# Patient Record
Sex: Male | Born: 1945 | Race: White | Hispanic: No | Marital: Married | Smoking: Former smoker
Health system: Southern US, Community
[De-identification: ages and names within clinical notes are randomized; demographics above are authoritative.]

## PROBLEM LIST (undated history)

## (undated) DIAGNOSIS — S86019A Strain of unspecified Achilles tendon, initial encounter: Secondary | ICD-10-CM

## (undated) DIAGNOSIS — T7840XA Allergy, unspecified, initial encounter: Secondary | ICD-10-CM

## (undated) DIAGNOSIS — E785 Hyperlipidemia, unspecified: Secondary | ICD-10-CM

## (undated) HISTORY — DX: Allergy, unspecified, initial encounter: T78.40XA

## (undated) HISTORY — PX: OTHER SURGICAL HISTORY: SHX169

## (undated) HISTORY — DX: Hyperlipidemia, unspecified: E78.5

## (undated) HISTORY — DX: Strain of unspecified achilles tendon, initial encounter: S86.019A

## (undated) HISTORY — PX: TONSILLECTOMY: SUR1361

## (undated) HISTORY — PX: KNEE ARTHROSCOPY: SUR90

---

## 2010-07-17 ENCOUNTER — Ambulatory Visit: Payer: Self-pay | Admitting: Internal Medicine

## 2010-07-17 DIAGNOSIS — S86819A Strain of other muscle(s) and tendon(s) at lower leg level, unspecified leg, initial encounter: Secondary | ICD-10-CM

## 2010-07-17 DIAGNOSIS — S838X9A Sprain of other specified parts of unspecified knee, initial encounter: Secondary | ICD-10-CM | POA: Insufficient documentation

## 2010-07-17 DIAGNOSIS — E785 Hyperlipidemia, unspecified: Secondary | ICD-10-CM | POA: Insufficient documentation

## 2010-07-17 DIAGNOSIS — N529 Male erectile dysfunction, unspecified: Secondary | ICD-10-CM

## 2010-07-17 DIAGNOSIS — J309 Allergic rhinitis, unspecified: Secondary | ICD-10-CM | POA: Insufficient documentation

## 2010-07-17 DIAGNOSIS — B009 Herpesviral infection, unspecified: Secondary | ICD-10-CM | POA: Insufficient documentation

## 2010-07-18 ENCOUNTER — Telehealth: Payer: Self-pay | Admitting: *Deleted

## 2010-07-29 ENCOUNTER — Telehealth: Payer: Self-pay | Admitting: Internal Medicine

## 2010-09-25 ENCOUNTER — Ambulatory Visit: Payer: Self-pay | Admitting: Internal Medicine

## 2010-09-25 LAB — CONVERTED CEMR LAB
AST: 21 units/L (ref 0–37)
Albumin: 3.8 g/dL (ref 3.5–5.2)
Alkaline Phosphatase: 53 units/L (ref 39–117)
Basophils Absolute: 0 10*3/uL (ref 0.0–0.1)
Basophils Relative: 0.5 % (ref 0.0–3.0)
Blood in Urine, dipstick: NEGATIVE
Calcium: 8.8 mg/dL (ref 8.4–10.5)
GFR calc non Af Amer: 99.45 mL/min (ref >60.00)
HDL: 65.1 mg/dL (ref >39.00)
Hemoglobin: 13.9 g/dL (ref 13.0–17.0)
Ketones, urine, test strip: NEGATIVE
Lymphocytes Relative: 29.6 % (ref 12.0–46.0)
Monocytes Relative: 10.2 % (ref 3.0–12.0)
Neutro Abs: 2.9 10*3/uL (ref 1.4–7.7)
Nitrite: NEGATIVE
RBC: 4.14 M/uL — ABNORMAL LOW (ref 4.22–5.81)
Sodium: 139 meq/L (ref 135–145)
Total CHOL/HDL Ratio: 4
Total Protein: 6.2 g/dL (ref 6.0–8.3)
Triglycerides: 126 mg/dL (ref 0.0–149.0)
Urobilinogen, UA: 0.2
WBC: 5.1 10*3/uL (ref 4.5–10.5)

## 2010-10-18 ENCOUNTER — Encounter: Payer: Self-pay | Admitting: Internal Medicine

## 2010-10-18 ENCOUNTER — Ambulatory Visit
Admission: RE | Admit: 2010-10-18 | Discharge: 2010-10-18 | Payer: Self-pay | Source: Home / Self Care | Attending: Internal Medicine | Admitting: Internal Medicine

## 2010-10-18 LAB — CONVERTED CEMR LAB
Cholesterol, target level: 200 mg/dL
HDL goal, serum: 40 mg/dL
LDL Goal: 160 mg/dL

## 2010-10-30 ENCOUNTER — Ambulatory Visit: Admit: 2010-10-30 | Payer: Self-pay | Admitting: Sports Medicine

## 2010-11-05 NOTE — Progress Notes (Signed)
Summary: refill.  Never got these filled originally.....  Phone Note Call from Patient   Caller: Patient Call For: Stacie Glaze MD Summary of Call: Pt is asking for Levitra to be mailed to his home, and Accyclovir sent electronically to [facility] [place]. Initial call taken by: Lynann Beaver CMA AAMA,  July 29, 2010 12:14 PM  Follow-up for Phone Call        pt states never got print out of acyclovir- will send to [facility] and will print levitra scripot and pt will pick up Follow-up by: Willy Eddy, LPN,  July 29, 2010 12:57 PM    Prescriptions: LEVITRA 20 MG TABS (VARDENAFIL HCL) Take as directed  #6 x 11   Entered by:   Willy Eddy, LPN   Authorized by:   Stacie Glaze MD   Signed by:   Willy Eddy, LPN on 04/54/0981   Method used:   Print then Give to Patient   RxID:   [id number] ACYCLOVIR 400 MG TABS (ACYCLOVIR) one by mouth three times a day as needed  #30 x 1   Entered by:   Willy Eddy, LPN   Authorized by:   Stacie Glaze MD   Signed by:   Willy Eddy, LPN on 57/84/6962   Method used:   Electronically to        [facility] (retail)       [address]       [place], [place]  [address]       Ph: [phone]       Fax: [phone]   RxID:   [id number]

## 2010-11-05 NOTE — Assessment & Plan Note (Signed)
Summary: new to establish--ok per dr jenkins//ccm   Vital Signs:  Patient profile:   65 year old male Height:      70.5 inches Weight:      170 pounds BMI:     24.13 Temp:     98.2 degrees F oral Pulse rate:   64 / minute Resp:     14 per minute BP sitting:   90 / 60  (left arm)  Vitals Entered By: Willy Eddy, LPN (July 17, 2010 2:27 PM) CC: new pt to establish--stopped lipitor for taking for while due to myalgia-c/o hamstring that will not heal properly Is Patient Diabetic? No Pain Assessment Patient in pain? no        Primary Care Kebin Maye:  Stacie Glaze MD  CC:  new pt to establish--stopped lipitor for taking for while due to myalgia-c/o hamstring that will not heal properly.  History of Present Illness: new to establish complete  PMHx and old records reviewed with pt ( from [place]) hx of hyperlipidemia with intolerance of daily lipitor risks include lipids, age, smoker (past) but no family hx  hx also includes hx of herpes ( cold sore) and ED on levitra with not hx of testosterone monitering  Preventive Screening-Counseling & Management  Alcohol-Tobacco     Smoking Status: quit     Year Started: 1967     Year Quit: 1973     Tobacco Counseling: to remain off tobacco products  Caffeine-Diet-Exercise     Does Patient Exercise: yes      Drug Use:  no.    Problems Prior to Update: 1)  Erectile Dysfunction, Organic  (ICD-607.84) 2)  Muscle Strain, Hamstring Muscle  (ICD-844.8) 3)  Herpes Labialis  (ICD-054.9) 4)  Family History Breast Cancer 1st Degree Relative <50  (ICD-V16.3) 5)  Family History of Alcoholism/addiction  (ICD-V61.41) 6)  Allergic Rhinitis  (ICD-477.9) 7)  Hyperlipidemia  (ICD-272.4)  Medications Prior to Update: 1)  None  Current Medications (verified): 1)  Acyclovir 400 Mg Tabs (Acyclovir) .... One By Mouth Three Times A Day As Needed 2)  Krill Oil 1000 Mg Caps (Krill Oil) .... Two  By Mouth Two Times A Day 3)  Co Q-10 Plus  Red Yeast Rice 60-600 Mg Caps (Coenzyme Q10-Red Yeast Rice) .... One By Mouth Daily 4)  Levitra 20 Mg Tabs (Vardenafil Hcl) .... Take As Directed  Allergies (verified): No Known Drug Allergies  Past History:  Family History: Last updated: 07/17/2010 Family History of Alcoholism/Addiction Family History Breast cancer 1st degree relative <50  Social History: Last updated: 07/17/2010 Occupation:actor,director,teacher Married Alcohol use-yes Drug use-no Regular exercise-yes  Risk Factors: Exercise: yes (07/17/2010)  Risk Factors: Smoking Status: quit (07/17/2010)  Past medical, surgical, family and social histories (including risk factors) reviewed, and no changes noted (except as noted below).  Past Medical History: Hyperlipidemia Allergic rhinitis  Past Surgical History: Tonsillectomy 1954  Family History: Reviewed history and no changes required. Family History of Alcoholism/Addiction Family History Breast cancer 1st degree relative <50  Social History: Reviewed history and no changes required. Occupation:actor,director,teacher Married Alcohol use-yes Drug use-no Regular exercise-yes Smoking Status:  quit Drug Use:  no Does Patient Exercise:  yes  Review of Systems  The patient denies anorexia, fever, weight loss, weight gain, vision loss, decreased hearing, hoarseness, chest pain, syncope, dyspnea on exertion, peripheral edema, prolonged cough, headaches, hemoptysis, abdominal pain, melena, hematochezia, severe indigestion/heartburn, hematuria, incontinence, genital sores, muscle weakness, suspicious skin lesions, transient blindness, difficulty walking, depression, unusual  weight change, abnormal bleeding, enlarged lymph nodes, angioedema, breast masses, and testicular masses.    Physical Exam  General:  alert, well-developed, and well-nourished.   Head:  normocephalic and atraumatic.   Eyes:  pupils equal and pupils round.   Ears:  R ear normal and L ear  normal.   Nose:  no external deformity and no nasal discharge.   Mouth:  good dentition and pharynx pink and moist.   Neck:  No deformities, masses, or tenderness noted. Lungs:  Normal respiratory effort, chest expands symmetrically. Lungs are clear to auscultation, no crackles or wheezes. Heart:  Normal rate and regular rhythm. S1 and S2 normal without gallop, murmur, click, rub or other extra sounds. Abdomen:  Bowel sounds positive,abdomen soft and non-tender without masses, organomegaly or hernias noted. Genitalia:  Testes bilaterally descended without nodularity, tenderness or masses. No scrotal masses or lesions. No penis lesions or urethral discharge. Prostate:  Prostate gland firm and smooth, no enlargement, nodularity, tenderness, mass, asymmetry or induration.   Impression & Recommendations:  Problem # 1:  HERPES LABIALIS (ICD-054.9) on acyclovir for outbreaks  Problem # 2:  HYPERLIPIDEMIA (ICD-272.4) stopped lipitor due to muscle cramping heard on NPR the risks and stopped  Problem # 3:  MUSCLE STRAIN, HAMSTRING MUSCLE (ICD-844.8) referral to [facility] sports medicine  clinic  Problem # 4:  ALLERGIC RHINITIS (ICD-477.9)  Discussed use of allergy medications and environmental measures.   Problem # 5:  ERECTILE DYSFUNCTION, ORGANIC (ICD-607.84)  Discussed proper use of medications, as well as side effects.   His updated medication list for this problem includes:    Levitra 20 Mg Tabs (Vardenafil hcl) .Marland Kitchen... Take as directed  Complete Medication List: 1)  Acyclovir 400 Mg Tabs (Acyclovir) .... One by mouth three times a day as needed 2)  Krill Oil 1000 Mg Caps (Krill oil) .... Two  by mouth two times a day 3)  Co Q-10 Plus Red Yeast Rice 60-600 Mg Caps (Coenzyme q10-red yeast rice) .... One by mouth daily 4)  Levitra 20 Mg Tabs (Vardenafil hcl) .... Take as directed  Patient Instructions: 1)  Please schedule a follow-up appointment in 3 months.  CPX with  laps Prescriptions: ACYCLOVIR 400 MG TABS (ACYCLOVIR) one by mouth three times a day as needed  #3 x 0   Entered and Authorized by:   Stacie Glaze MD   Signed by:   Stacie Glaze MD on 07/17/2010   Method used:   Print then Give to Patient   RxID:   [id number]    Preventive Care Screening  Colonoscopy:    Date:  07/06/2003    Next Due:  07/2013    Results:  normal  Last Tetanus Booster:    Date:  05/02/2005    Results:  Historical    Immunization History:  Tetanus/Td Immunization History:    Tetanus/Td:  historical (05/02/2005)     Preventive Care Screening  Colonoscopy:    Date:  07/06/2003    Next Due:  07/2013    Results:  normal  Last Tetanus Booster:    Date:  05/02/2005    Results:  Historical

## 2010-11-05 NOTE — Progress Notes (Signed)
Summary: Pt called and gave info on what pharmacy he uses.  Phone Note Call from Patient   Caller: Patient Reason for Call: Acute Illness Summary of Call: Pt called and said that he uses [facility]. Pt just wanted Dr. Lovell Sheehan to have this infor for future use, when script are needed.    Initial call taken by: Lucy Antigua,  July 18, 2010 8:45 AM  Follow-up for Phone Call        put in computer Follow-up by: Willy Eddy, LPN,  July 18, 2010 8:49 AM

## 2010-11-07 NOTE — Assessment & Plan Note (Signed)
Summary: CPX/CJR   Vital Signs:  Patient profile:   65 year old male Height:      70.5 inches Weight:      172 pounds BMI:     24.42 Temp:     98.2 degrees F oral Pulse rate:   52 / minute Resp:     14 per minute BP sitting:   100 / 60  (left arm)  Vitals Entered By: Willy Eddy, LPN (October 18, 2010 2:09 PM) CC: cpx, Lipid Management Is Patient Diabetic? No   Primary Care Provider:  Stacie Glaze MD  CC:  cpx and Lipid Management.  History of Present Illness: The pt was asked about all immunizations, health maint. services that are appropriate to their age and was given guidance on diet exercize  and weight management   he has been doing well and has no problems to report other than the chronic issues of erectile dysfunction hyperlipidemia and minor osteoarthritic pain. he continues on pulse therapy with Crestor 20 mg one by mouth weekly this has been effective in controlling his cholesterol has not resulted in side effects he  remains physically active and exercises regularly  his erectile dysfunction is treated with Levitra we discussed the monitoring of testosterone and testosterone replacement as a possible adjuvant therapy to his erectile dysfunction.   Lipid Management History:      Positive NCEP/ATP III risk factors include male age 56 years old or older.  Negative NCEP/ATP III risk factors include HDL cholesterol greater than 60 and non-tobacco-user status.    Preventive Screening-Counseling & Management  Alcohol-Tobacco     Smoking Status: quit     Year Started: 1967     Year Quit: 1973     Tobacco Counseling: to remain off tobacco products  Problems Prior to Update: 1)  Preventive Health Care  (ICD-V70.0) 2)  Erectile Dysfunction, Organic  (ICD-607.84) 3)  Muscle Strain, Hamstring Muscle  (ICD-844.8) 4)  Herpes Labialis  (ICD-054.9) 5)  Family History Breast Cancer 1st Degree Relative <50  (ICD-V16.3) 6)  Family History of Alcoholism/addiction   (ICD-V61.41) 7)  Allergic Rhinitis  (ICD-477.9) 8)  Hyperlipidemia  (ICD-272.4)  Current Problems (verified): 1)  Erectile Dysfunction, Organic  (ICD-607.84) 2)  Muscle Strain, Hamstring Muscle  (ICD-844.8) 3)  Herpes Labialis  (ICD-054.9) 4)  Family History Breast Cancer 1st Degree Relative <50  (ICD-V16.3) 5)  Family History of Alcoholism/addiction  (ICD-V61.41) 6)  Allergic Rhinitis  (ICD-477.9) 7)  Hyperlipidemia  (ICD-272.4)  Medications Prior to Update: 1)  Acyclovir 400 Mg Tabs (Acyclovir) .... One By Mouth Three Times A Day As Needed 2)  Krill Oil 1000 Mg Caps (Krill Oil) .... Two  By Mouth Two Times A Day 3)  Co Q-10 Plus Red Yeast Rice 60-600 Mg Caps (Coenzyme Q10-Red Yeast Rice) .... One By Mouth Daily 4)  Levitra 20 Mg Tabs (Vardenafil Hcl) .... Take As Directed  Current Medications (verified): 1)  Acyclovir 400 Mg Tabs (Acyclovir) .... One By Mouth Three Times A Day As Needed 2)  Krill Oil 1000 Mg Caps (Krill Oil) .... Two  By Mouth Two Times A Day 3)  Levitra 20 Mg Tabs (Vardenafil Hcl) .... Take As Directed 4)  Aspirin 81 Mg Tbec (Aspirin) .Marland Kitchen.. 1 Once Daily 5)  Multivitamins  Tabs (Multiple Vitamin) .Marland Kitchen.. 1 Once Daily 6)  Crestor 20 Mg Tabs (Rosuvastatin Calcium) .... One By Mouth Friday Nights  Allergies (verified): No Known Drug Allergies  Past History:  Family History:  Last updated: 07/17/2010 Family History of Alcoholism/Addiction Family History Breast cancer 1st degree relative <50  Social History: Last updated: 07/17/2010 Occupation:actor,director,teacher Married Alcohol use-yes Drug use-no Regular exercise-yes  Risk Factors: Exercise: yes (07/17/2010)  Risk Factors: Smoking Status: quit (10/18/2010)  Past medical, surgical, family and social histories (including risk factors) reviewed, and no changes noted (except as noted below).  Past Medical History: Reviewed history from 07/17/2010 and no changes required. Hyperlipidemia Allergic  rhinitis  Past Surgical History: Reviewed history from 07/17/2010 and no changes required. Tonsillectomy 1954  Family History: Reviewed history from 07/17/2010 and no changes required. Family History of Alcoholism/Addiction Family History Breast cancer 1st degree relative <50  Social History: Reviewed history from 07/17/2010 and no changes required. Occupation:actor,director,teacher Married Alcohol use-yes Drug use-no Regular exercise-yes  Review of Systems  The patient denies anorexia, fever, weight loss, weight gain, vision loss, decreased hearing, hoarseness, chest pain, syncope, dyspnea on exertion, peripheral edema, prolonged cough, headaches, hemoptysis, abdominal pain, melena, hematochezia, severe indigestion/heartburn, hematuria, incontinence, genital sores, muscle weakness, suspicious skin lesions, transient blindness, difficulty walking, depression, unusual weight change, abnormal bleeding, enlarged lymph nodes, angioedema, and breast masses.         Flu Vaccine Consent Questions     Do you have a history of severe allergic reactions to this vaccine? no    Any prior history of allergic reactions to egg and/or gelatin? no    Do you have a sensitivity to the preservative Thimersol? no    Do you have a past history of Guillan-Barre Syndrome? no    Do you currently have an acute febrile illness? no    Have you ever had a severe reaction to latex? no    Vaccine information given and explained to patient? yes    Are you currently pregnant? no    Lot Number:[id number]   Exp Date:04/05/2011   Site Given  Left Deltoid IM   Physical Exam  General:  alert, well-developed, and well-nourished.   Head:  normocephalic and atraumatic.   Eyes:  pupils equal and pupils round.   Ears:  R ear normal and L ear normal.   Nose:  no external deformity and no nasal discharge.   Mouth:  good dentition and pharynx pink and moist.   Neck:  No deformities, masses, or tenderness  noted. Lungs:  Normal respiratory effort, chest expands symmetrically. Lungs are clear to auscultation, no crackles or wheezes. Heart:  Normal rate and regular rhythm. S1 and S2 normal without gallop, murmur, click, rub or other extra sounds. Abdomen:  Bowel sounds positive,abdomen soft and non-tender without masses, organomegaly or hernias noted. Genitalia:  Testes bilaterally descended without nodularity, tenderness or masses. No scrotal masses or lesions. No penis lesions or urethral discharge. Prostate:  Prostate gland firm and smooth, no enlargement, nodularity, tenderness, mass, asymmetry or induration.   Impression & Recommendations:  Problem # 1:  PREVENTIVE HEALTH CARE (ICD-V70.0) Assessment Unchanged  Orders: EKG w/ Interpretation (93000)  Colonoscopy: normal (07/06/2003) Td Booster: Historical (05/02/2005)   Flu Vax: Fluvax 3+ (10/18/2010)   Chol: 268 (09/25/2010)   HDL: 65.10 (09/25/2010)   TG: 126.0 (09/25/2010) TSH: 1.73 (09/25/2010)   PSA: 1.33 (09/25/2010) Next Colonoscopy due:: 07/2013 (07/17/2010)  Discussed using sunscreen, use of alcohol, drug use, self testicular exam, routine dental care, routine eye care, routine physical exam, seat belts, multiple vitamins, osteoporosis prevention, adequate calcium intake in diet, and recommendations for immunizations.  Discussed exercise and checking cholesterol.  Discussed gun safety, safe sex, and contraception.  Also recommend checking PSA.  Problem # 2:  HYPERLIPIDEMIA (ICD-272.4) Assessment: Unchanged  currently at goal with excellent toleration of pulse therapy Labs Reviewed: SGOT: 21 (09/25/2010)   SGPT: 21 (09/25/2010)  Lipid Goals: Chol Goal: 200 (10/18/2010)   HDL Goal: 40 (10/18/2010)   LDL Goal: 160 (10/18/2010)   TG Goal: 150 (10/18/2010)  10 Yr Risk Heart Disease: Not enough information   HDL:65.10 (09/25/2010)  Chol:268 (09/25/2010)  Trig:126.0 (09/25/2010)  His updated medication list for this problem  includes:    Crestor 20 Mg Tabs (Rosuvastatin calcium) ..... One by mouth friday nights  Problem # 3:  MUSCLE STRAIN, HAMSTRING MUSCLE (ICD-844.8)  persistant pain in hamdstring with recurrent injury in sports  Orders: Sports Medicine (Sports Med)  Problem # 4:  ERECTILE DYSFUNCTION, ORGANIC (ICD-607.84)  monitor testosterone and CC views a candidate for testosterone  monitoring.he continues to feel that occasional Levitra is adequate treatment for his rectal dysfunction His updated medication list for this problem includes:    Levitra 20 Mg Tabs (Vardenafil hcl) .Marland Kitchen... Take as directed  Discussed proper use of medications, as well as side effects.   Complete Medication List: 1)  Acyclovir 400 Mg Tabs (Acyclovir) .... One by mouth three times a day as needed 2)  Krill Oil 1000 Mg Caps (Krill oil) .... Two  by mouth two times a day 3)  Levitra 20 Mg Tabs (Vardenafil hcl) .... Take as directed 4)  Aspirin 81 Mg Tbec (Aspirin) .Marland Kitchen.. 1 once daily 5)  Multivitamins Tabs (Multiple vitamin) .Marland Kitchen.. 1 once daily 6)  Crestor 20 Mg Tabs (Rosuvastatin calcium) .... One by mouth friday nights  Other Orders: Admin 1st Vaccine (04540) Flu Vaccine 21yrs + (98119)  Lipid Assessment/Plan:      Based on NCEP/ATP III, the patient's risk factor category is "0-1 risk factors".  The patient's lipid goals are as follows: Total cholesterol goal is 200; LDL cholesterol goal is 160; HDL cholesterol goal is 40; Triglyceride goal is 150.     Patient Instructions: 1)  Please schedule a follow-up appointment in 3 months. 2)  Hepatic Panel prior to visit, ICD-9:995.20 3)  Lipid Panel prior to visit, ICD-9:272.4   Orders Added: 1)  Admin 1st Vaccine [90471] 2)  Flu Vaccine 20yrs + [14782] 3)  EKG w/ Interpretation [93000] 4)  Sports Medicine [Sports Med] 5)  Est. Patient 40-64 years [99396] 6)  Est. Patient Level III [95621]

## 2010-11-07 NOTE — Letter (Signed)
Summary: Handout Printed  Printed Handout:  - *[facility] Patient Instructions 

## 2010-11-18 ENCOUNTER — Ambulatory Visit: Payer: Self-pay | Admitting: Sports Medicine

## 2010-12-09 ENCOUNTER — Ambulatory Visit (INDEPENDENT_AMBULATORY_CARE_PROVIDER_SITE_OTHER): Payer: Self-pay | Admitting: Sports Medicine

## 2010-12-09 ENCOUNTER — Encounter: Payer: Self-pay | Admitting: Sports Medicine

## 2010-12-09 DIAGNOSIS — M25569 Pain in unspecified knee: Secondary | ICD-10-CM

## 2010-12-09 DIAGNOSIS — S86819A Strain of other muscle(s) and tendon(s) at lower leg level, unspecified leg, initial encounter: Secondary | ICD-10-CM

## 2010-12-17 NOTE — Assessment & Plan Note (Signed)
Summary: 8:45 NP HAMSTRING INJURY/MJD   Vital Signs:  Patient profile:   65 year old male Height:      70 inches Weight:      160 pounds BP sitting:   107 / 70  Vitals Entered By: Lillia Pauls CMA (December 09, 2010 8:42 AM)  Primary Provider:  Stacie Glaze MD  CC:  Right thight pain and Left knee injury .  History of Present Illness:   Hamstring Injury- March 2011 during a raquectball match, feels like it  just won't heal, was on Lipitorat one point and felt that was contributing to the prolonged healing process, took Ibuprofen, used ICE for a a month directly after injury-  uses a thigh wrap when playing now  , typically does not have to take anti-inflammatories Plays doubles with raquetball Does not work out his lower extremeties at the gym  Left Knee- quit running 10 years ago secondary to bilateral knee pain, 3 weeks ago was playing raquetball and began to have worsening knee pain, no specific injury no fall to the knee, noticed swelling behind the knee and medial aspect of knee, also - noticed disoloaration on media aspect of knee , continued playing raquetball - currently using a knee stabilizer-  swelling improving, uses Ibuprofen prn , no locking or giving out of knee , pain improved on medial aspect of knee where swelling is   Note- no  specific imaging of knees have been done   Exercise- per above and Uses Ellipital , unable to use treadmill or bike secondary to knee pain   Current Medications (verified): 1)  Acyclovir 400 Mg Tabs (Acyclovir) .... One By Mouth Three Times A Day As Needed 2)  Krill Oil 1000 Mg Caps (Krill Oil) .... Two  By Mouth Two Times A Day 3)  Levitra 20 Mg Tabs (Vardenafil Hcl) .... Take As Directed 4)  Aspirin 81 Mg Tbec (Aspirin) .Marland Kitchen.. 1 Once Daily 5)  Multivitamins  Tabs (Multiple Vitamin) .Marland Kitchen.. 1 Once Daily 6)  Crestor 20 Mg Tabs (Rosuvastatin Calcium) .... One By Mouth Friday Nights  Allergies (verified): No Known Drug Allergies  Past  History:  Past Medical History: Last updated: 07/17/2010 Hyperlipidemia Allergic rhinitis  Past Surgical History: Last updated: 07/17/2010 Tonsillectomy 1954  Family History: Last updated: 07/17/2010 Family History of Alcoholism/Addiction Family History Breast cancer 1st degree relative <50  Social History: Last updated: 07/17/2010 Occupation:actor,director,teacher Married Alcohol use-yes Drug use-no Regular exercise-yes    Physical Exam  General:  NAD, vitals noted Msk:  Knee: Left knee- +effusion noted on medial and posterior aspects of knee, mild warmth Mild TTP over medial aspect, patella non tender, inferior  joint line non tender ROM decreased in extension- 3 degress left knee Flexion- left knee 130 degrees Flexion- Right knee 150 degrees +McMurrays- left knee +crepitus Neg anterior drawer, neg lachmans Ligaments with solid consistent endpoints including ACL, PCL, LCL, MCL. Patellar and quadriceps tendons unremarkable.  Hamstring and quadriceps strength is normal. - left leg 4/5  strength - Right hamstring  Leg length- left 0.5in shorter than right this seems 2/2 lack of full knee extension      Impression & Recommendations:  Problem # 1:  KNEE PAIN, LEFT (ICD-719.46) Assessment New Chronic knee pain with an acute process, exam shows evidence of arthritic changes, and likley old medial meniscal problem. Effusion improved and pain tolerable at this point.  See instructions Given exercises to strengthen quads regarding knee pain, continue knee brace, as needed anti-inflammatories, no  imaging needed Start Glucoasmine/Condroitin -  His updated medication list for this problem includes:    Aspirin 81 Mg Tbec (Aspirin) .Marland Kitchen... 1 once daily  Problem # 2:  MUSCLE STRAIN, HAMSTRING MUSCLE (ICD-844.8) Assessment: New Chronic problem, he has never truly rehabed and strengthened the right hamstring, given exercises to complete at least 3 times a week Follow-up in  6 weeks to monitor progress   Milinda Antis MD, PGY-3  Complete Medication List: 1)  Acyclovir 400 Mg Tabs (Acyclovir) .... One by mouth three times a day as needed 2)  Krill Oil 1000 Mg Caps (Krill oil) .... Two  by mouth two times a day 3)  Levitra 20 Mg Tabs (Vardenafil hcl) .... Take as directed 4)  Aspirin 81 Mg Tbec (Aspirin) .Marland Kitchen.. 1 once daily 5)  Multivitamins Tabs (Multiple vitamin) .Marland Kitchen.. 1 once daily 6)  Crestor 20 Mg Tabs (Rosuvastatin calcium) .... One by mouth friday nights  Patient Instructions: 1)  Start the Hamstring exercises 2)  Start the quad exercises to help strengthen your left knee 3)  Continue using the knee sleeve 4)  Glucosamine Condroitin  700/650mg - take 1 tablet by mouth twice a day for 3 months  5)  Use Aleve or ibuprofen as needed 6)  Semi-recumbant Biking  7)  Use the wedge in the left shoe 8)  Follow-up in 6 weeks   Orders Added: 1)  Consultation Level II [62952]

## 2010-12-17 NOTE — Letter (Signed)
Summary: *Consult Note  [facility]  [address]   [place], [place] [address]   Phone: [phone]  Fax: [phone]    Re:    Gordon Baker [place] DOB:    1945/12/10 Dr. Darryll Capers [facility] 12/09/2010   Dear Gordon Baker:    Thank you for requesting that we see the above patient for consultation.  A copy of the detailed office note will be sent under separate cover, for your review.  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  I don't think we have to do additional workup at this point as it will not change the conservative rehab plan outlined.  His Right Hamstring is still weak compared to left.  This has never been strengthened since the original injury and he is given a home protocol.  I will recheck results of this in 6 weeks.   New Orders include:  1)  Consultation Level II [99242]  New Medications started today include: Glucosamine + chondroitin trial    After today's visit, the patients current medications include: 1)  ACYCLOVIR 400 MG TABS (ACYCLOVIR) one by mouth three times a day as needed 2)  KRILL OIL 1000 MG CAPS (KRILL OIL) two  by mouth two times a day 3)  LEVITRA 20 MG TABS (VARDENAFIL HCL) Take as directed 4)  ASPIRIN 81 MG TBEC (ASPIRIN) 1 once daily 5)  MULTIVITAMINS  TABS (MULTIPLE VITAMIN) 1 once daily 6)  CRESTOR 20 MG TABS (ROSUVASTATIN CALCIUM) one by mouth friday nights   Thank you for this consultation.  If you have any further questions regarding the care of this patient, please do not hesitate to contact me @ [phone].  Thank you for this opportunity to look after your patient.  Sincerely,  Vincent Gros MD

## 2011-01-08 ENCOUNTER — Encounter: Payer: Self-pay | Admitting: Internal Medicine

## 2011-01-08 ENCOUNTER — Other Ambulatory Visit (INDEPENDENT_AMBULATORY_CARE_PROVIDER_SITE_OTHER): Payer: BC Managed Care – PPO

## 2011-01-08 DIAGNOSIS — T887XXA Unspecified adverse effect of drug or medicament, initial encounter: Secondary | ICD-10-CM

## 2011-01-08 DIAGNOSIS — E785 Hyperlipidemia, unspecified: Secondary | ICD-10-CM

## 2011-01-08 LAB — LIPID PANEL
Cholesterol: 240 mg/dL — ABNORMAL HIGH (ref 0–200)
HDL: 67.6 mg/dL
Total CHOL/HDL Ratio: 4
Triglycerides: 86 mg/dL (ref 0.0–149.0)
VLDL: 17.2 mg/dL (ref 0.0–40.0)

## 2011-01-08 LAB — HEPATIC FUNCTION PANEL
ALT: 22 U/L (ref 0–53)
AST: 26 U/L (ref 0–37)
Albumin: 3.8 g/dL (ref 3.5–5.2)
Alkaline Phosphatase: 57 U/L (ref 39–117)
Bilirubin, Direct: 0.1 mg/dL (ref 0.0–0.3)
Total Bilirubin: 1 mg/dL (ref 0.3–1.2)
Total Protein: 6.4 g/dL (ref 6.0–8.3)

## 2011-01-08 LAB — LDL CHOLESTEROL, DIRECT: Direct LDL: 149.2 mg/dL

## 2011-01-10 ENCOUNTER — Other Ambulatory Visit: Payer: Self-pay

## 2011-01-17 ENCOUNTER — Ambulatory Visit: Payer: Self-pay | Admitting: Internal Medicine

## 2011-02-12 ENCOUNTER — Encounter: Payer: Self-pay | Admitting: Internal Medicine

## 2011-02-12 ENCOUNTER — Ambulatory Visit (INDEPENDENT_AMBULATORY_CARE_PROVIDER_SITE_OTHER): Payer: BC Managed Care – PPO | Admitting: Internal Medicine

## 2011-02-12 VITALS — BP 100/60 | HR 60 | Temp 98.2°F | Resp 14 | Ht 71.0 in | Wt 170.0 lb

## 2011-02-12 DIAGNOSIS — E756 Lipid storage disorder, unspecified: Secondary | ICD-10-CM

## 2011-02-12 NOTE — Patient Instructions (Signed)
Increase the kril will capsules to 4 a day and will monitor in 3 months

## 2011-02-12 NOTE — Progress Notes (Signed)
  Subjective:    Patient ID: Gordon Baker, male    DOB: 03-May-1946, 65 y.o.   MRN: [id number]  HPI    Review of Systems  Constitutional: Negative for fever and fatigue.  HENT: Negative for hearing loss, congestion, neck pain and postnasal drip.   Eyes: Negative for discharge, redness and visual disturbance.  Respiratory: Negative for cough, shortness of breath and wheezing.   Cardiovascular: Negative for leg swelling.  Gastrointestinal: Negative for abdominal pain, constipation and abdominal distention.  Genitourinary: Negative for urgency and frequency.  Musculoskeletal: Negative for joint swelling and arthralgias.  Skin: Negative for color change and rash.  Neurological: Negative for weakness and light-headedness.  Hematological: Negative for adenopathy.  Psychiatric/Behavioral: Negative for behavioral problems.       Objective:   Physical Exam  Constitutional: He appears well-developed and well-nourished.  HENT:  Head: Normocephalic and atraumatic.  Eyes: Conjunctivae are normal. Pupils are equal, round, and reactive to light.  Neck: Normal range of motion. Neck supple.  Cardiovascular: Normal rate and regular rhythm.   Pulmonary/Chest: Effort normal and breath sounds normal.  Abdominal: Soft. Bowel sounds are normal.          Assessment & Plan:     The patient presents for followup of intervention on cholesterol.  His initial cholesterol was 268 with a triglyceride of 126 and an HDL of 65 and LDL C. of 173 due to the elevation in the LDL C. which has a positive predictor for heart disease we wanted to lower his total cholesterol and his LDL C. through natural methods if possible.  We began with prolonged L. our goal was to take 4000 mg but we achieved 2000 mg and we did see an interval result in that the total cholesterol dropped from 268-240 the triglycerides went from 126-86 and the HDL while the total cholesterol dropped increased to 67 most importantly the LDL  dropped from 173-149 our goal is to increase the kril oil with a goal of an LDL C. of less than 1:30

## 2011-02-17 ENCOUNTER — Ambulatory Visit (INDEPENDENT_AMBULATORY_CARE_PROVIDER_SITE_OTHER)
Admission: RE | Admit: 2011-02-17 | Discharge: 2011-02-17 | Disposition: A | Discharge status: Home / Self Care | Payer: BC Managed Care – PPO | Source: Ambulatory Visit | Attending: Internal Medicine | Admitting: Internal Medicine

## 2011-02-17 ENCOUNTER — Telehealth: Payer: Self-pay | Admitting: *Deleted

## 2011-02-17 DIAGNOSIS — W19XXXA Unspecified fall, initial encounter: Secondary | ICD-10-CM

## 2011-02-17 DIAGNOSIS — S6990XA Unspecified injury of unspecified wrist, hand and finger(s), initial encounter: Secondary | ICD-10-CM

## 2011-02-17 DIAGNOSIS — S62109A Fracture of unspecified carpal bone, unspecified wrist, initial encounter for closed fracture: Secondary | ICD-10-CM

## 2011-02-17 NOTE — Telephone Encounter (Signed)
Will order left wrist xray per Dr. Lovell Sheehan

## 2011-02-17 NOTE — Telephone Encounter (Signed)
Referral to Ortho sent to [facility], and pt notifed.  Per Dr. Lovell Sheehan

## 2011-02-17 NOTE — Telephone Encounter (Signed)
Pt fell down steps Saturday night, and injured his wrist.  Would like to get it xrayed.

## 2011-05-12 ENCOUNTER — Telehealth: Payer: Self-pay | Admitting: *Deleted

## 2011-05-12 MED ORDER — ACYCLOVIR 400 MG PO TABS: 400.0000 mg | ORAL_TABLET | Freq: Three times a day (TID) | ORAL | Status: DC

## 2011-05-12 NOTE — Telephone Encounter (Signed)
Pt would like to have Acyclovir called to [facility]  States Dr. Lovell Sheehan has never prescribed it , but knows he is on it.

## 2011-05-12 NOTE — Telephone Encounter (Signed)
May call in 400 mg tid  prn Number 30

## 2011-05-15 ENCOUNTER — Telehealth: Payer: Self-pay | Admitting: Internal Medicine

## 2011-05-15 NOTE — Telephone Encounter (Signed)
Pt requesting refill on acyclovir (ZOVIRAX) 400 MG tablet   [facility]

## 2011-05-16 ENCOUNTER — Other Ambulatory Visit: Payer: Self-pay | Admitting: *Deleted

## 2011-05-16 MED ORDER — ACYCLOVIR 400 MG PO TABS: 400.0000 mg | ORAL_TABLET | Freq: Three times a day (TID) | ORAL | Status: DC | PRN

## 2011-05-16 NOTE — Telephone Encounter (Signed)
done

## 2011-06-19 ENCOUNTER — Other Ambulatory Visit: Payer: Self-pay | Admitting: Internal Medicine

## 2011-11-05 ENCOUNTER — Telehealth: Payer: Self-pay | Admitting: Internal Medicine

## 2011-11-05 ENCOUNTER — Other Ambulatory Visit: Payer: Self-pay | Admitting: *Deleted

## 2011-11-05 MED ORDER — VARDENAFIL HCL 20 MG PO TABS: 20.0000 mg | ORAL_TABLET | Freq: Every day | ORAL | Status: DC | PRN

## 2011-11-05 NOTE — Telephone Encounter (Signed)
Done

## 2011-11-05 NOTE — Telephone Encounter (Signed)
Pt called req refill of vardenafil (LEVITRA) 20 MG tablet [[id number]] to [facility] on [address].

## 2012-02-16 ENCOUNTER — Telehealth: Payer: Self-pay | Admitting: Internal Medicine

## 2012-02-16 NOTE — Telephone Encounter (Signed)
May have fasting lipid and liver 1 week prior- 272.4

## 2012-02-16 NOTE — Telephone Encounter (Signed)
Pt would like to have labs done prior to his appt on 04/02/12 . Been over a year since last ov please advise

## 2012-03-09 ENCOUNTER — Telehealth: Payer: Self-pay | Admitting: Family Medicine

## 2012-03-09 DIAGNOSIS — Z0000 Encounter for general adult medical examination without abnormal findings: Secondary | ICD-10-CM

## 2012-03-09 NOTE — Telephone Encounter (Signed)
Pt going to [facility] lab 6.21 for fasting lipid and liver. Can you put the orders in please? Thanks.

## 2012-03-26 ENCOUNTER — Other Ambulatory Visit (INDEPENDENT_AMBULATORY_CARE_PROVIDER_SITE_OTHER): Payer: BC Managed Care – PPO

## 2012-03-26 ENCOUNTER — Other Ambulatory Visit: Payer: BC Managed Care – PPO

## 2012-03-26 DIAGNOSIS — Z0000 Encounter for general adult medical examination without abnormal findings: Secondary | ICD-10-CM

## 2012-03-26 LAB — LIPID PANEL
Cholesterol: 206 mg/dL — ABNORMAL HIGH (ref 0–200)
HDL: 75.9 mg/dL (ref >39.00)
Triglycerides: 56 mg/dL (ref 0.0–149.0)
VLDL: 11.2 mg/dL (ref 0.0–40.0)

## 2012-03-26 LAB — CBC WITH DIFFERENTIAL/PLATELET
Basophils Relative: 0.4 % (ref 0.0–3.0)
Eosinophils Absolute: 0.1 10*3/uL (ref 0.0–0.7)
Eosinophils Relative: 1.6 % (ref 0.0–5.0)
HCT: 41 % (ref 39.0–52.0)
Lymphs Abs: 1.5 10*3/uL (ref 0.7–4.0)
MCHC: 33.4 g/dL (ref 30.0–36.0)
MCV: 99.3 fl (ref 78.0–100.0)
Monocytes Absolute: 0.6 10*3/uL (ref 0.1–1.0)
Neutro Abs: 2.7 10*3/uL (ref 1.4–7.7)
RBC: 4.13 Mil/uL — ABNORMAL LOW (ref 4.22–5.81)
WBC: 4.9 10*3/uL (ref 4.5–10.5)

## 2012-03-26 LAB — BASIC METABOLIC PANEL
BUN: 17 mg/dL (ref 6–23)
Chloride: 104 mEq/L (ref 96–112)
GFR: 76.3 mL/min (ref >60.00)
Potassium: 4.8 mEq/L (ref 3.5–5.1)
Sodium: 139 mEq/L (ref 135–145)

## 2012-03-26 LAB — HEPATIC FUNCTION PANEL
ALT: 22 U/L (ref 0–53)
Albumin: 3.7 g/dL (ref 3.5–5.2)
Total Protein: 6.1 g/dL (ref 6.0–8.3)

## 2012-03-26 LAB — TSH: TSH: 1.78 u[IU]/mL (ref 0.35–5.50)

## 2012-03-26 LAB — LDL CHOLESTEROL, DIRECT: Direct LDL: 116.8 mg/dL

## 2012-04-02 ENCOUNTER — Encounter: Payer: Self-pay | Admitting: Internal Medicine

## 2012-04-02 ENCOUNTER — Ambulatory Visit (INDEPENDENT_AMBULATORY_CARE_PROVIDER_SITE_OTHER): Payer: BC Managed Care – PPO | Admitting: Internal Medicine

## 2012-04-02 VITALS — BP 120/70 | HR 68 | Temp 98.2°F | Resp 14 | Ht 71.0 in | Wt 158.0 lb

## 2012-04-02 DIAGNOSIS — Z0000 Encounter for general adult medical examination without abnormal findings: Secondary | ICD-10-CM

## 2012-04-02 NOTE — Progress Notes (Signed)
Subjective:    Patient ID: Gordon Baker, male    DOB: Sep 23, 1946, 66 y.o.   MRN: [id number]  HPI cpx  DOING WELL Had a cortisone shot in knee for bursitis     Review of Systems  Constitutional: Negative for fever and fatigue.  HENT: Negative for hearing loss, congestion, neck pain and postnasal drip.   Eyes: Negative for discharge, redness and visual disturbance.  Respiratory: Negative for cough, shortness of breath and wheezing.   Cardiovascular: Negative for leg swelling.  Gastrointestinal: Negative for abdominal pain, constipation and abdominal distention.  Genitourinary: Negative for urgency and frequency.  Musculoskeletal: Negative for joint swelling and arthralgias.  Skin: Negative for color change and rash.  Neurological: Negative for weakness and light-headedness.  Hematological: Negative for adenopathy.  Psychiatric/Behavioral: Negative for behavioral problems.   Past Medical History  Diagnosis Date  . Hyperlipidemia   . Allergy     History   Social History  . Marital Status: Married    Spouse Name: N/A    Number of Children: N/A  . Years of Education: N/A   Occupational History  . actor,director,teacher [employer]   Social History Main Topics  . Smoking status: Never Smoker   . Smokeless tobacco: Not on file  . Alcohol Use: Yes  . Drug Use: No  . Sexually Active: Yes   Other Topics Concern  . Not on file   Social History Narrative  . No narrative on file    Past Surgical History  Procedure Date  . Tonsillectomy     Family History  Problem Relation Age of Onset  . Alzheimer's disease Mother     80  . Alcohol abuse Father   . Heart disease Father     No Known Allergies  Current Outpatient Prescriptions on File Prior to Visit  Medication Sig Dispense Refill  . acyclovir (ZOVIRAX) 400 MG tablet TAKE 1 TABLET BY MOUTH THREE TIMES DAILY  30 tablet  1  . aspirin 81 MG tablet Take 81 mg by mouth daily.        Marland Kitchen KRILL OIL 1000 MG  CAPS Take 2 capsules by mouth 2 (two) times daily.        . multivitamin (THERAGRAN) per tablet Take 1 tablet by mouth daily.        . rosuvastatin (CRESTOR) 20 MG tablet Take 20 mg by mouth. 1 on friday       . vardenafil (LEVITRA) 20 MG tablet Take 1 tablet (20 mg total) by mouth daily as needed.  10 tablet  5    BP 120/70  Pulse 68  Temp 98.2 F (36.8 C)  Resp 14  Ht 5\' 11"  (1.803 m)  Wt 158 lb (71.668 kg)  BMI 22.04 kg/m2       Objective:   Physical Exam  Nursing note and vitals reviewed. Constitutional: He is oriented to person, place, and time. He appears well-developed and well-nourished.  HENT:  Head: Normocephalic and atraumatic.  Eyes: Conjunctivae are normal. Pupils are equal, round, and reactive to light.  Neck: Normal range of motion. Neck supple.  Cardiovascular: Normal rate and regular rhythm.   Pulmonary/Chest: Effort normal and breath sounds normal.  Abdominal: Soft. Bowel sounds are normal.  Genitourinary: Rectum normal and prostate normal.  Musculoskeletal: Normal range of motion.  Neurological: He is alert and oriented to person, place, and time.  Skin: Skin is warm and dry.  Psychiatric: He has a normal mood and affect. His behavior is normal.  Assessment & Plan:   Patient presents for yearly preventative medicine examination.   all immunizations and health maintenance protocols were reviewed with the patient and they are up to date with these protocols.   screening laboratory values were reviewed with the patient including screening of hyperlipidemia PSA renal function and hepatic function.   There medications past medical history social history problem list and allergies were reviewed in detail .   Goals were established with regard to weight loss exercise diet in compliance with medications

## 2012-06-04 IMAGING — CR DG WRIST COMPLETE 3+V*L*
2 series · 2 of 2 positions shown · non-contrast
Comparison: None.

CLINICAL DATA: Medial wrist pain status post fall 2 days ago.

LEFT WRIST - COMPLETE 3+ VIEW

[view not recorded (1 of 2)]
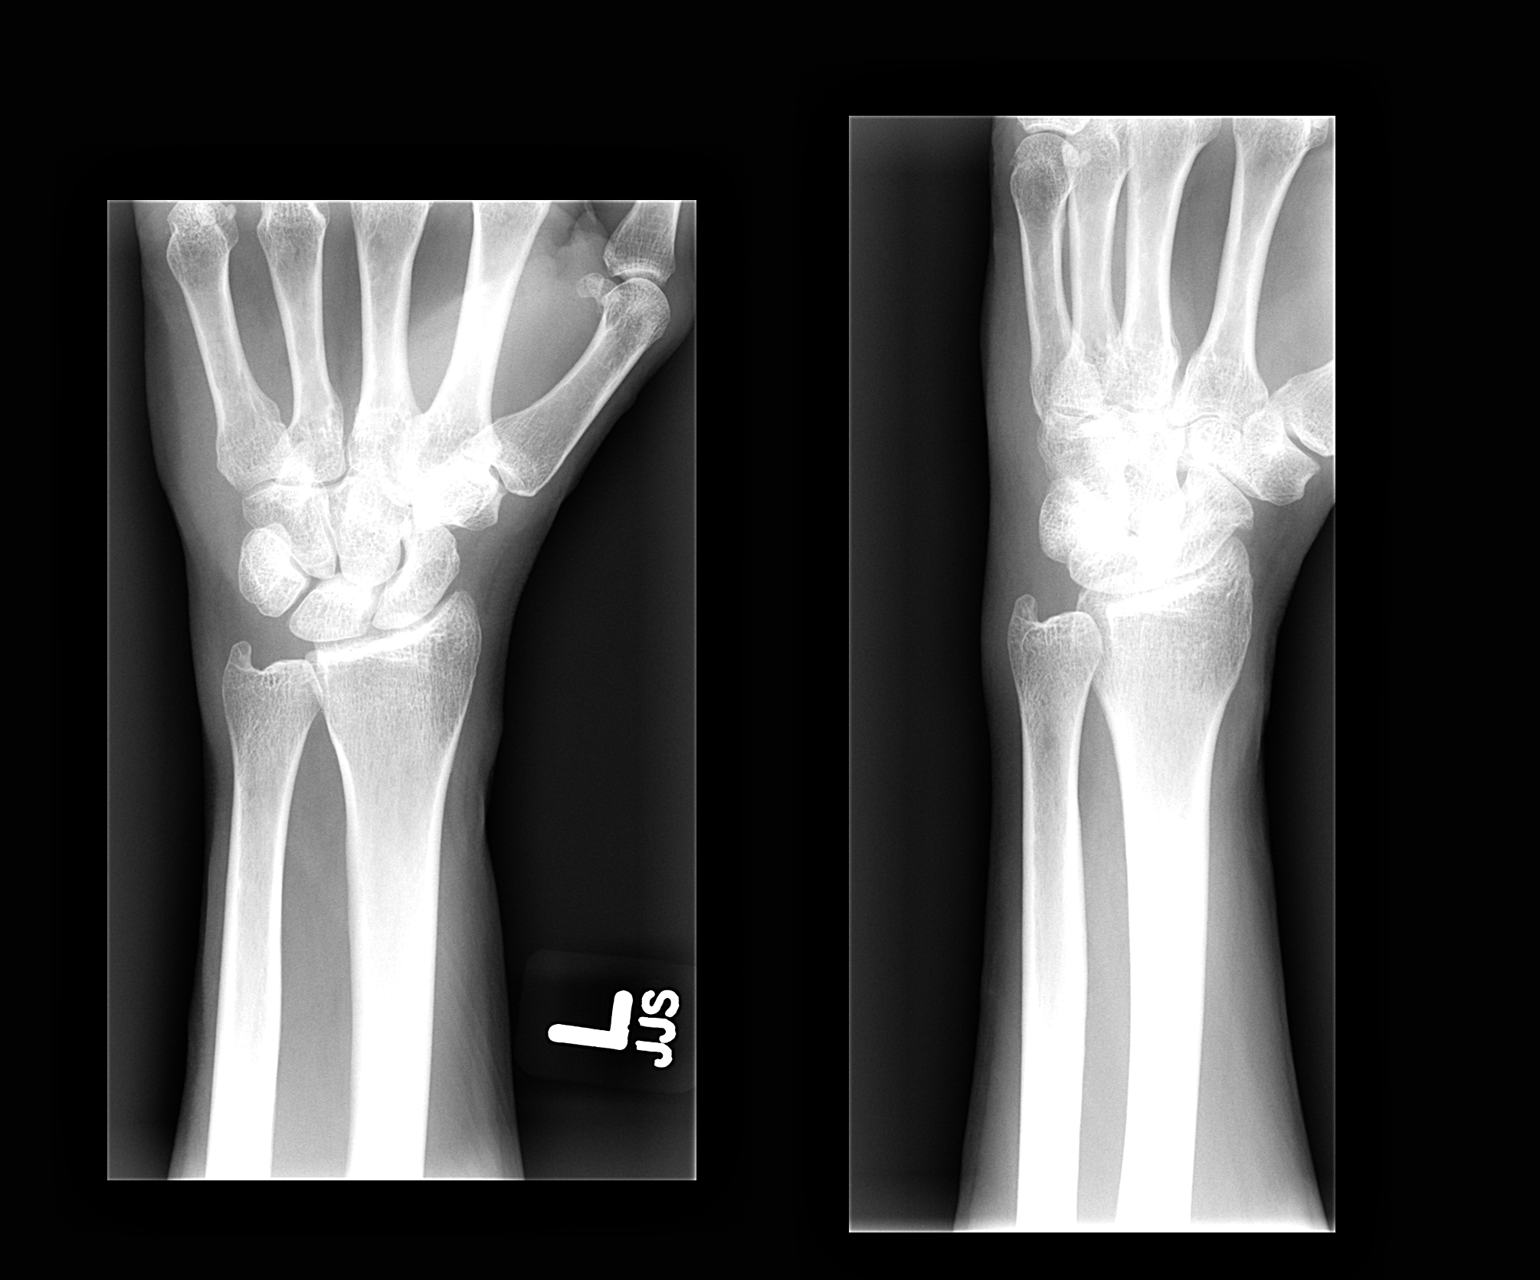

[view not recorded (2 of 2)]
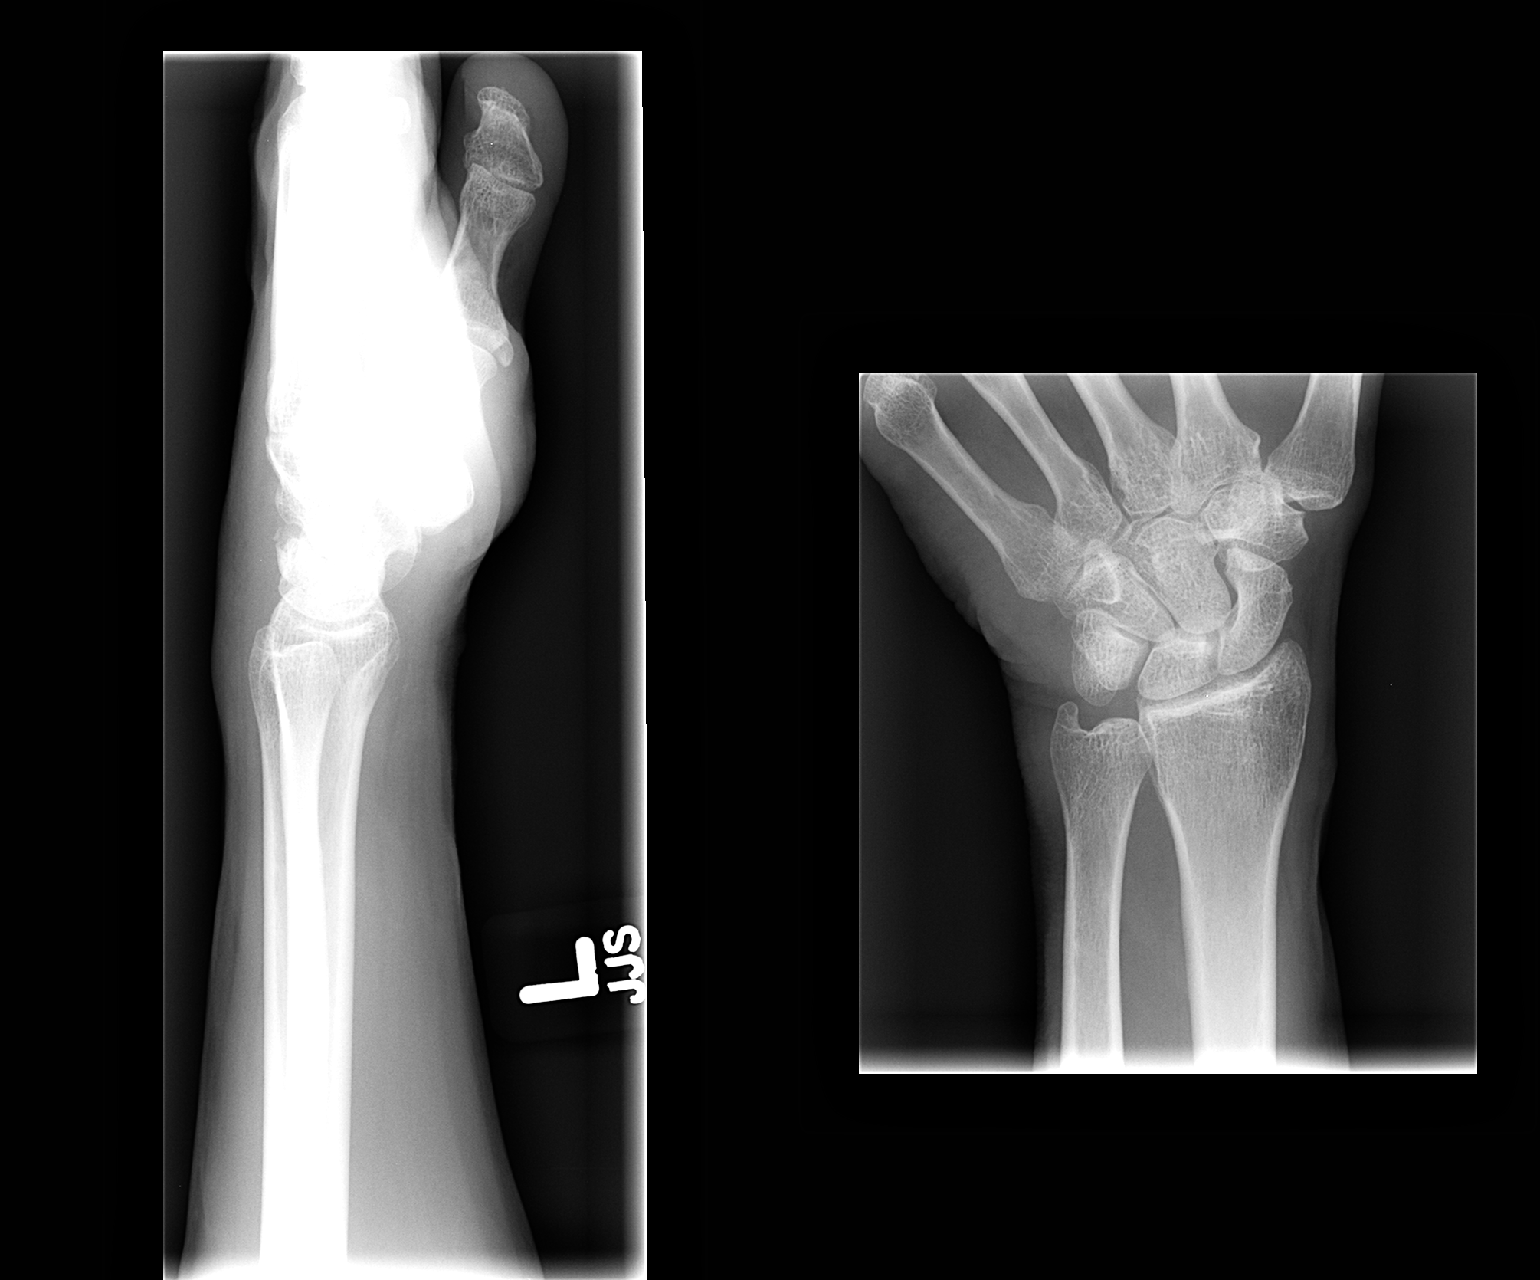

[2 of 2 positions shown; findings below may reference images not displayed]

FINDINGS: There is dorsal soft tissue swelling.  On the lateral
view, there is a probable small dorsal avulsion fracture from the
proximal carpal row, not well seen on the additional views.  The
carpal bone alignment is normal.  There is no evidence of
dislocation.
IMPRESSION: Dorsal soft tissue swelling with suspected small avulsion fracture
posteriorly.

## 2012-12-27 ENCOUNTER — Other Ambulatory Visit: Payer: Self-pay | Admitting: Internal Medicine

## 2013-05-03 ENCOUNTER — Telehealth: Payer: Self-pay | Admitting: Internal Medicine

## 2013-05-03 ENCOUNTER — Other Ambulatory Visit: Payer: Self-pay | Admitting: Internal Medicine

## 2013-05-03 MED ORDER — ZOLPIDEM TARTRATE 5 MG PO TABS: 5.0000 mg | ORAL_TABLET | Freq: Every evening | ORAL | Status: DC | PRN

## 2013-05-03 NOTE — Telephone Encounter (Signed)
May have ambien 5 mg #30 with no refills per dr Lovell Sheehan

## 2013-05-03 NOTE — Telephone Encounter (Signed)
Pt is travelling from [place] to [place] and requesting something to  Help him sleep on flight. [employer] [facility]

## 2013-06-03 ENCOUNTER — Other Ambulatory Visit: Payer: Self-pay | Admitting: Internal Medicine

## 2013-08-01 ENCOUNTER — Ambulatory Visit (INDEPENDENT_AMBULATORY_CARE_PROVIDER_SITE_OTHER): Payer: BC Managed Care – PPO

## 2013-08-01 DIAGNOSIS — Z23 Encounter for immunization: Secondary | ICD-10-CM

## 2013-12-16 ENCOUNTER — Telehealth: Payer: Self-pay | Admitting: Internal Medicine

## 2013-12-16 DIAGNOSIS — Z0000 Encounter for general adult medical examination without abnormal findings: Secondary | ICD-10-CM

## 2013-12-16 NOTE — Telephone Encounter (Signed)
Future orders placed 

## 2013-12-16 NOTE — Telephone Encounter (Signed)
Pt would like to go [facility] for his cpx labs. Can you put in computer?  (cpe on 3/23)

## 2013-12-19 ENCOUNTER — Other Ambulatory Visit: Payer: Self-pay | Admitting: Internal Medicine

## 2013-12-19 DIAGNOSIS — N529 Male erectile dysfunction, unspecified: Secondary | ICD-10-CM

## 2013-12-19 DIAGNOSIS — E785 Hyperlipidemia, unspecified: Secondary | ICD-10-CM

## 2013-12-19 DIAGNOSIS — Z0000 Encounter for general adult medical examination without abnormal findings: Secondary | ICD-10-CM

## 2013-12-22 ENCOUNTER — Other Ambulatory Visit (INDEPENDENT_AMBULATORY_CARE_PROVIDER_SITE_OTHER): Payer: BC Managed Care – PPO

## 2013-12-22 DIAGNOSIS — N529 Male erectile dysfunction, unspecified: Secondary | ICD-10-CM

## 2013-12-22 DIAGNOSIS — Z0000 Encounter for general adult medical examination without abnormal findings: Secondary | ICD-10-CM

## 2013-12-22 DIAGNOSIS — E785 Hyperlipidemia, unspecified: Secondary | ICD-10-CM

## 2013-12-22 LAB — BASIC METABOLIC PANEL
BUN: 19 mg/dL (ref 6–23)
CALCIUM: 9.1 mg/dL (ref 8.4–10.5)
CO2: 28 mEq/L (ref 19–32)
Chloride: 103 mEq/L (ref 96–112)
Creatinine, Ser: 1 mg/dL (ref 0.4–1.5)
GFR: 80.32 mL/min (ref >60.00)
Glucose, Bld: 94 mg/dL (ref 70–99)
Potassium: 4.5 mEq/L (ref 3.5–5.1)
SODIUM: 139 meq/L (ref 135–145)

## 2013-12-22 LAB — LIPID PANEL
CHOL/HDL RATIO: 4
Cholesterol: 304 mg/dL — ABNORMAL HIGH (ref 0–200)
HDL: 83 mg/dL (ref >39.00)
LDL CALC: 203 mg/dL — AB (ref 0–99)
Triglycerides: 92 mg/dL (ref 0.0–149.0)
VLDL: 18.4 mg/dL (ref 0.0–40.0)

## 2013-12-22 LAB — HEPATIC FUNCTION PANEL
ALBUMIN: 4.1 g/dL (ref 3.5–5.2)
ALT: 21 U/L (ref 0–53)
AST: 19 U/L (ref 0–37)
Alkaline Phosphatase: 57 U/L (ref 39–117)
Bilirubin, Direct: 0.1 mg/dL (ref 0.0–0.3)
Total Bilirubin: 1 mg/dL (ref 0.3–1.2)
Total Protein: 6.8 g/dL (ref 6.0–8.3)

## 2013-12-22 LAB — PSA: PSA: 1.74 ng/mL (ref 0.10–4.00)

## 2013-12-22 LAB — URINALYSIS
Bilirubin Urine: NEGATIVE
Hgb urine dipstick: NEGATIVE
Ketones, ur: NEGATIVE
LEUKOCYTES UA: NEGATIVE
NITRITE: NEGATIVE
SPECIFIC GRAVITY, URINE: 1.02 (ref 1.000–1.030)
Total Protein, Urine: NEGATIVE
UROBILINOGEN UA: 0.2 (ref 0.0–1.0)
Urine Glucose: NEGATIVE
pH: 6 (ref 5.0–8.0)

## 2013-12-26 ENCOUNTER — Encounter: Payer: Self-pay | Admitting: Gastroenterology

## 2013-12-26 ENCOUNTER — Ambulatory Visit (INDEPENDENT_AMBULATORY_CARE_PROVIDER_SITE_OTHER): Payer: BC Managed Care – PPO | Admitting: Internal Medicine

## 2013-12-26 ENCOUNTER — Encounter: Payer: Self-pay | Admitting: Internal Medicine

## 2013-12-26 VITALS — BP 104/70 | HR 60 | Temp 98.0°F | Ht 70.0 in | Wt 168.0 lb

## 2013-12-26 DIAGNOSIS — E785 Hyperlipidemia, unspecified: Secondary | ICD-10-CM

## 2013-12-26 DIAGNOSIS — Z0000 Encounter for general adult medical examination without abnormal findings: Secondary | ICD-10-CM

## 2013-12-26 DIAGNOSIS — Z23 Encounter for immunization: Secondary | ICD-10-CM

## 2013-12-26 DIAGNOSIS — Z2911 Encounter for prophylactic immunotherapy for respiratory syncytial virus (RSV): Secondary | ICD-10-CM

## 2013-12-26 DIAGNOSIS — Z1211 Encounter for screening for malignant neoplasm of colon: Secondary | ICD-10-CM

## 2013-12-26 MED ORDER — ACYCLOVIR 400 MG PO TABS: ORAL_TABLET | ORAL | Status: DC

## 2013-12-26 MED ORDER — ZOLPIDEM TARTRATE 5 MG PO TABS: 5.0000 mg | ORAL_TABLET | Freq: Every evening | ORAL | Status: DC | PRN

## 2013-12-26 MED ORDER — ROSUVASTATIN CALCIUM 20 MG PO TABS: 20.0000 mg | ORAL_TABLET | ORAL | Status: DC

## 2013-12-26 NOTE — Progress Notes (Signed)
Pre visit review using our clinic review tool, if applicable. No additional management support is needed unless otherwise documented below in the visit note. 

## 2013-12-26 NOTE — Progress Notes (Signed)
   Subjective:    Patient ID: Gordon Baker, male    DOB: 1946/08/18, 68 y.o.   MRN: [id number]  HPI CPX Occasional non exercise related chest tightness Atypical rash on arms Immunizations reviewed Colon due   Review of Systems  Constitutional: Negative for fever and fatigue.  HENT: Negative for congestion, hearing loss and postnasal drip.   Eyes: Negative for discharge, redness and visual disturbance.  Respiratory: Negative for cough, shortness of breath and wheezing.   Cardiovascular: Negative for leg swelling.  Gastrointestinal: Negative for abdominal pain, constipation and abdominal distention.  Genitourinary: Negative for urgency and frequency.  Musculoskeletal: Negative for arthralgias, joint swelling and neck pain.  Skin: Negative for color change and rash.  Neurological: Negative for weakness and light-headedness.  Hematological: Negative for adenopathy.  Psychiatric/Behavioral: Negative for behavioral problems.       Objective:   Physical Exam  Constitutional: He is oriented to person, place, and time. He appears well-developed and well-nourished.  HENT:  Head: Normocephalic and atraumatic.  Eyes: Conjunctivae are normal. Pupils are equal, round, and reactive to light.  Neck: Normal range of motion. Neck supple.  Cardiovascular: Normal rate and regular rhythm.   Pulmonary/Chest: Effort normal and breath sounds normal.  Abdominal: Soft. Bowel sounds are normal.  Genitourinary: Rectum normal and prostate normal.  Musculoskeletal: Normal range of motion.  Neurological: He is alert and oriented to person, place, and time.  Skin: Skin is warm and dry.  Psychiatric: He has a normal mood and affect. His behavior is normal.          Assessment & Plan:  Patient presents for yearly preventative medicine examination. Medicare questionnaire was completed  All immunizations and health maintenance protocols were reviewed with the patient and needed orders were  placed.  Appropriate screening laboratory values were ordered for the patient including screening of hyperlipidemia, renal function and hepatic function. If indicated by BPH, a PSA was ordered.  Medication reconciliation,  past medical history, social history, problem list and allergies were reviewed in detail with the patient  Goals were established with regard to weight loss, exercise, and  diet in compliance with medications  End of life planning was discussed.  Due  prevnar and shingles Colon   Needs lipid control crestor one a week 20 mg

## 2013-12-26 NOTE — Progress Notes (Signed)
The patient is instructed to continue all medications as prescribed. Schedule followup with check out clerk upon leaving the clinic  

## 2013-12-26 NOTE — Addendum Note (Signed)
Addended by: Alfred LevinsWYRICK, CINDY D on: 12/26/2013 01:29 PM   Modules accepted: Orders

## 2013-12-26 NOTE — Patient Instructions (Signed)
crestor 20 mg once a week

## 2014-01-18 ENCOUNTER — Encounter: Payer: BC Managed Care – PPO | Admitting: Internal Medicine

## 2014-02-24 ENCOUNTER — Encounter: Payer: BC Managed Care – PPO | Admitting: Gastroenterology

## 2014-03-28 ENCOUNTER — Ambulatory Visit (HOSPITAL_COMMUNITY): Payer: BC Managed Care – PPO | Attending: Cardiology | Admitting: Cardiology

## 2014-03-28 ENCOUNTER — Other Ambulatory Visit (HOSPITAL_COMMUNITY): Payer: Self-pay

## 2014-03-28 DIAGNOSIS — M79609 Pain in unspecified limb: Secondary | ICD-10-CM | POA: Insufficient documentation

## 2014-03-28 DIAGNOSIS — M79606 Pain in leg, unspecified: Secondary | ICD-10-CM

## 2014-03-28 DIAGNOSIS — Z87891 Personal history of nicotine dependence: Secondary | ICD-10-CM | POA: Insufficient documentation

## 2014-03-28 DIAGNOSIS — M7989 Other specified soft tissue disorders: Secondary | ICD-10-CM

## 2014-03-28 DIAGNOSIS — E785 Hyperlipidemia, unspecified: Secondary | ICD-10-CM | POA: Insufficient documentation

## 2014-03-28 NOTE — Progress Notes (Signed)
Lower extremity venous duplex unilateral completed.

## 2014-03-30 ENCOUNTER — Ambulatory Visit (AMBULATORY_SURGERY_CENTER): Payer: Self-pay | Admitting: *Deleted

## 2014-03-30 ENCOUNTER — Telehealth: Payer: Self-pay | Admitting: *Deleted

## 2014-03-30 ENCOUNTER — Telehealth: Payer: Self-pay | Admitting: Gastroenterology

## 2014-03-30 VITALS — Ht 69.0 in | Wt 169.4 lb

## 2014-03-30 DIAGNOSIS — Z1211 Encounter for screening for malignant neoplasm of colon: Secondary | ICD-10-CM

## 2014-03-30 MED ORDER — NA SULFATE-K SULFATE-MG SULF 17.5-3.13-1.6 GM/177ML PO SOLN: ORAL | Status: DC

## 2014-03-30 MED ORDER — MOVIPREP 100 G PO SOLR: ORAL | Status: DC

## 2014-03-30 NOTE — Telephone Encounter (Signed)
Gordon Baker,  Pt is scheduled for a colonoscopy with Dr. Arlyce DiceKaplan.  He [facility]; [facility]  He had one [facility] since [facility].  He isn't sure when. Release of info formed filled out.  I completed his PV and told him you would call him when you have received his records from [facility]. He has his prep instructions and just needs times and dates filled in as to when his appt will be.  He was told to call back to see if we received these records.  Colonoscopy for 04-11-14 cancelled  Thanks, Baxter HireKristen

## 2014-03-30 NOTE — Telephone Encounter (Signed)
Faxed for records today  Gordon CruiseKristin put pt back on for rthe 7th

## 2014-03-30 NOTE — Progress Notes (Signed)
No egg or soy allergy  No diet medications taken  Pt denies anesthesia problems or that he has ever been told he is difficult to intubate  Pt had colon done at [facility] last time- unsure of who did it or when.  Procedure cancelled and release of info filled out and given to Avanell Shackleton Stallings CMA  Phone note sent to her as well.  I told pt to call back to see if records were received.

## 2014-03-30 NOTE — Telephone Encounter (Signed)
Pt has only had procedure [facility]; not at [facility].  [facility]  R Stallings made aware and no need to get records

## 2014-03-31 ENCOUNTER — Encounter: Payer: Self-pay | Admitting: Gastroenterology

## 2014-03-31 NOTE — Telephone Encounter (Signed)
Patient did not have [facility]

## 2014-04-11 ENCOUNTER — Ambulatory Visit (AMBULATORY_SURGERY_CENTER): Payer: BC Managed Care – PPO | Admitting: Gastroenterology

## 2014-04-11 ENCOUNTER — Encounter: Payer: BC Managed Care – PPO | Admitting: Gastroenterology

## 2014-04-11 ENCOUNTER — Encounter: Payer: Self-pay | Admitting: Gastroenterology

## 2014-04-11 VITALS — BP 114/76 | HR 54 | Temp 96.5°F | Resp 16 | Ht 69.0 in | Wt 169.0 lb

## 2014-04-11 DIAGNOSIS — K648 Other hemorrhoids: Secondary | ICD-10-CM

## 2014-04-11 DIAGNOSIS — Z1211 Encounter for screening for malignant neoplasm of colon: Secondary | ICD-10-CM

## 2014-04-11 MED ORDER — SODIUM CHLORIDE 0.9 % IV SOLN: 500.0000 mL | INTRAVENOUS | Status: DC

## 2014-04-11 NOTE — Op Note (Signed)
[facility] [address] [place], [id number]   COLONOSCOPY PROCEDURE REPORT  PATIENT: Gordon Baker, Gordon B.  MR#: [id number] BIRTHDATE: 1945/10/07 , 67  yrs. old GENDER: Male ENDOSCOPIST: Louis Meckelobert D Kaplan, MD REFERRED BY: PROCEDURE DATE:  04/11/2014 PROCEDURE:   Colonoscopy, diagnostic First Screening Colonoscopy - Avg.  risk and is 50 yrs.  old or older - No.  Prior Negative Screening - Now for repeat screening. 10 or more years since last screening  History of Adenoma - Now for follow-up colonoscopy & has been > or = to 3 yrs.  N/A  Polyps Removed Today? No.  Recommend repeat exam, <10 yrs? No. ASA CLASS:   Class I INDICATIONS:Average risk patient for colon cancer. MEDICATIONS: MAC sedation, administered by CRNA and Propofol (Diprivan) 230 mg IV  DESCRIPTION OF PROCEDURE:   After the risks benefits and alternatives of the procedure were thoroughly explained, informed consent was obtained.  A digital rectal exam revealed no abnormalities of the rectum.   The LB [id number]  endoscope was introduced through the anus and advanced to the cecum, which was identified by both the appendix and ileocecal valve. No adverse events experienced.   The quality of the prep was excellent using Suprep  The instrument was then slowly withdrawn as the colon was fully examined.      COLON FINDINGS: Internal hemorrhoids were found.   A normal appearing cecum, ileocecal valve, and appendiceal orifice were identified.  The ascending, hepatic flexure, transverse, splenic flexure, descending, sigmoid colon and rectum appeared unremarkable.  No polyps or cancers were seen.  Retroflexed views revealed no abnormalities. The time to cecum=4 minutes 25 seconds. Withdrawal time=6 minutes 09 seconds.  The scope was withdrawn and the procedure completed. COMPLICATIONS: There were no complications.  ENDOSCOPIC IMPRESSION: 1.   Internal hemorrhoids 2.   Normal  colon  RECOMMENDATIONS: Continue current colorectal screening recommendations for "routine risk" patients with a repeat colonoscopy in 10 years.   eSigned:  Louis Meckelobert D Kaplan, MD 04/11/2014 8:35 AM   cc: Stacie GlazeJohn E Jenkins, MD

## 2014-04-11 NOTE — Progress Notes (Signed)
Report to PACU, RN, vss, BBS= Clear.  

## 2014-04-11 NOTE — Patient Instructions (Signed)
YOU HAD AN ENDOSCOPIC PROCEDURE TODAY AT THE [facility]: Refer to the procedure report that was given to you for any specific questions about what was found during the examination.  If the procedure report does not answer your questions, please call your gastroenterologist to clarify.  If you requested that your care partner not be given the details of your procedure findings, then the procedure report has been included in a sealed envelope for you to review at your convenience later.  YOU SHOULD EXPECT: Some feelings of bloating in the abdomen. Passage of more gas than usual.  Walking can help get rid of the air that was put into your GI tract during the procedure and reduce the bloating. If you had a lower endoscopy (such as a colonoscopy or flexible sigmoidoscopy) you may notice spotting of blood in your stool or on the toilet paper. If you underwent a bowel prep for your procedure, then you may not have a normal bowel movement for a few days.  DIET: Your first meal following the procedure should be a light meal and then it is ok to progress to your normal diet.  A half-sandwich or bowl of soup is an example of a good first meal.  Heavy or fried foods are harder to digest and may make you feel nauseous or bloated.  Likewise meals heavy in dairy and vegetables can cause extra gas to form and this can also increase the bloating.  Drink plenty of fluids but you should avoid alcoholic beverages for 24 hours.  ACTIVITY: Your care partner should take you home directly after the procedure.  You should plan to take it easy, moving slowly for the rest of the day.  You can resume normal activity the day after the procedure however you should NOT DRIVE or use heavy machinery for 24 hours (because of the sedation medicines used during the test).    SYMPTOMS TO REPORT IMMEDIATELY: A gastroenterologist can be reached at any hour.  During normal business hours, 8:30 AM to 5:00 PM Monday through Friday,  call [phone].  After hours and on weekends, please call the GI answering service at [phone] who will take a message and have the physician on call contact you.   Following lower endoscopy (colonoscopy or flexible sigmoidoscopy):  Excessive amounts of blood in the stool  Significant tenderness or worsening of abdominal pains  Swelling of the abdomen that is new, acute  Fever of 100F or higher  FOLLOW UP: If any biopsies were taken you will be contacted by phone or by letter within the next 1-3 weeks.  Call your gastroenterologist if you have not heard about the biopsies in 3 weeks.  Our staff will call the home number listed on your records the next business day following your procedure to check on you and address any questions or concerns that you may have at that time regarding the information given to you following your procedure. This is a courtesy call and so if there is no answer at the home number and we have not heard from you through the emergency physician on call, we will assume that you have returned to your regular daily activities without incident.  SIGNATURES/CONFIDENTIALITY: You and/or your care partner have signed paperwork which will be entered into your electronic medical record.  These signatures attest to the fact that that the information above on your After Visit Summary has been reviewed and is understood.  Full responsibility of the confidentiality of this   discharge information lies with you and/or your care-partner.   Resume medications. Information given on hemorrhoids and high fiber diet with discharge instructions. 

## 2014-04-12 ENCOUNTER — Telehealth: Payer: Self-pay | Admitting: *Deleted

## 2014-04-12 NOTE — Telephone Encounter (Signed)
  Follow up Call-  Call back number 04/11/2014  Post procedure Call Back phone  # [phone]  Permission to leave phone message Yes     Patient questions:  Do you have a fever, pain , or abdominal swelling? No. Pain Score  0 *  Have you tolerated food without any problems? Yes.    Have you been able to return to your normal activities? Yes.    Do you have any questions about your discharge instructions: Diet   No. Medications  No. Follow up visit  No.  Do you have questions or concerns about your Care? No.  Actions: * If pain score is 4 or above: No action needed, pain <4.

## 2014-07-27 ENCOUNTER — Encounter: Payer: Self-pay | Admitting: Internal Medicine

## 2014-07-28 NOTE — Telephone Encounter (Signed)
Pt would like blood work order to be put in today so he can go Monday to get blood work done before cpe on next week

## 2014-08-03 ENCOUNTER — Ambulatory Visit: Payer: BC Managed Care – PPO | Admitting: Internal Medicine

## 2014-08-11 ENCOUNTER — Encounter: Payer: Self-pay | Admitting: Internal Medicine

## 2014-08-11 ENCOUNTER — Ambulatory Visit (INDEPENDENT_AMBULATORY_CARE_PROVIDER_SITE_OTHER): Payer: BC Managed Care – PPO | Admitting: Internal Medicine

## 2014-08-11 ENCOUNTER — Ambulatory Visit (INDEPENDENT_AMBULATORY_CARE_PROVIDER_SITE_OTHER): Payer: BC Managed Care – PPO | Admitting: Geriatric Medicine

## 2014-08-11 ENCOUNTER — Other Ambulatory Visit (INDEPENDENT_AMBULATORY_CARE_PROVIDER_SITE_OTHER): Payer: BC Managed Care – PPO

## 2014-08-11 VITALS — BP 108/60 | HR 64 | Temp 97.9°F | Resp 14 | Ht 70.0 in | Wt 174.8 lb

## 2014-08-11 DIAGNOSIS — Z23 Encounter for immunization: Secondary | ICD-10-CM

## 2014-08-11 DIAGNOSIS — Z0000 Encounter for general adult medical examination without abnormal findings: Secondary | ICD-10-CM

## 2014-08-11 DIAGNOSIS — N529 Male erectile dysfunction, unspecified: Secondary | ICD-10-CM

## 2014-08-11 DIAGNOSIS — E785 Hyperlipidemia, unspecified: Secondary | ICD-10-CM

## 2014-08-11 DIAGNOSIS — N528 Other male erectile dysfunction: Secondary | ICD-10-CM

## 2014-08-11 DIAGNOSIS — M25562 Pain in left knee: Secondary | ICD-10-CM

## 2014-08-11 DIAGNOSIS — B009 Herpesviral infection, unspecified: Secondary | ICD-10-CM

## 2014-08-11 LAB — LIPID PANEL
CHOL/HDL RATIO: 4
Cholesterol: 233 mg/dL — ABNORMAL HIGH (ref 0–200)
HDL: 59.3 mg/dL (ref >39.00)
LDL Cholesterol: 140 mg/dL — ABNORMAL HIGH (ref 0–99)
NONHDL: 173.7
Triglycerides: 167 mg/dL — ABNORMAL HIGH (ref 0.0–149.0)
VLDL: 33.4 mg/dL (ref 0.0–40.0)

## 2014-08-11 NOTE — Progress Notes (Signed)
Pre visit review using our clinic review tool, if applicable. No additional management support is needed unless otherwise documented below in the visit note. 

## 2014-08-11 NOTE — Patient Instructions (Signed)
Your health is great, keep up the good work. We will check on your cholesterol today.  Come back sometime in the next several months to get your pneumonia shot, this will help to keep you from getting pneumonia.   Come back for a checkup in the next year.

## 2014-08-14 DIAGNOSIS — Z0001 Encounter for general adult medical examination with abnormal findings: Secondary | ICD-10-CM | POA: Insufficient documentation

## 2014-08-14 NOTE — Assessment & Plan Note (Signed)
Continue Levitra as needed 

## 2014-08-14 NOTE — Assessment & Plan Note (Signed)
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

## 2014-08-14 NOTE — Assessment & Plan Note (Signed)
Patient up-to-date on colonoscopy, has had shingles shot, tetanus shot updated within the last 10 years, has had flu shot already this year. He declined the pneumonia shot at today's visit however will try to come back within the next couple of months.

## 2014-08-14 NOTE — Assessment & Plan Note (Signed)
Currently taking Crestor once weekly. Will check lipid panel today and adjust therapy as needed.

## 2014-08-14 NOTE — Assessment & Plan Note (Signed)
Denies recent outbreak, he uses acyclovir with flares as needed.

## 2014-08-14 NOTE — Progress Notes (Signed)
   Subjective:    Patient ID: Gordon Baker, male    DOB: [birth year], 68 y.o.   MRN: [id number]  HPI The patient is a 68 year old man who comes in here today to establish care. He has past history of herpes, erectile dysfunction, joint pain. Overall he is doing well and has no complaints. He denies chest pains, breathing problems, abdominal pain, nausea, vomiting. He denies any recent outbreaks. He got the flu shot earlier this week at a drugstore and does not wish to get any further shots as he is in a play this weekend and does not feel that he can get sick. 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 He plays racquetball for exercise.  Review of Systems  Constitutional: Negative for fever, activity change, appetite change, fatigue and unexpected weight change.  HENT: Negative.   Eyes: Negative.   Respiratory: Negative for cough, chest tightness, shortness of breath and wheezing.   Cardiovascular: Negative for chest pain, palpitations and leg swelling.  Gastrointestinal: Negative for abdominal pain, diarrhea, constipation and abdominal distention.  Genitourinary: Negative.   Musculoskeletal: Negative.   Skin: Negative.   Neurological: Negative.       Objective:   Physical Exam  Constitutional: He is oriented to person, place, and time. He appears well-developed and well-nourished. No distress.  HENT:  Head: Normocephalic and atraumatic.  Eyes: EOM are normal.  Neck: Normal range of motion.  Cardiovascular: Normal rate and regular rhythm.   Pulmonary/Chest: Effort normal and breath sounds normal.  Abdominal: Soft. Bowel sounds are normal.  Neurological: He is alert and oriented to person, place, and time. Coordination normal.  Skin: Skin is warm and dry.   Filed Vitals:   08/11/14 1421  BP: 108/60  Pulse: 64  Temp: 97.9 F (36.6 C)  TempSrc: Oral  Resp: 14  Height: 5\' 10"  (1.778 m)  Weight: 174 lb 12.8 oz (79.289 kg)  SpO2: 95%      Assessment & Plan:

## 2015-01-22 ENCOUNTER — Encounter: Payer: Self-pay | Admitting: Internal Medicine

## 2015-01-22 ENCOUNTER — Other Ambulatory Visit: Payer: Self-pay | Admitting: Geriatric Medicine

## 2015-01-22 MED ORDER — ACYCLOVIR 400 MG PO TABS: ORAL_TABLET | ORAL | Status: DC

## 2016-03-04 ENCOUNTER — Telehealth: Payer: Self-pay | Admitting: Internal Medicine

## 2016-03-04 NOTE — Telephone Encounter (Signed)
Ok with me 

## 2016-03-04 NOTE — Telephone Encounter (Signed)
Fine with me

## 2016-03-04 NOTE — Telephone Encounter (Signed)
Pt called in and would like to tran from [place] to [place].  Would just prefer a male provider.  This ok with both of you?

## 2016-03-13 ENCOUNTER — Telehealth: Payer: Self-pay

## 2016-03-13 NOTE — Telephone Encounter (Signed)
error 

## 2016-03-19 ENCOUNTER — Other Ambulatory Visit (INDEPENDENT_AMBULATORY_CARE_PROVIDER_SITE_OTHER): Payer: Medicare Other

## 2016-03-19 ENCOUNTER — Ambulatory Visit (INDEPENDENT_AMBULATORY_CARE_PROVIDER_SITE_OTHER): Payer: Medicare Other | Admitting: Family

## 2016-03-19 ENCOUNTER — Encounter: Payer: Self-pay | Admitting: Family

## 2016-03-19 VITALS — BP 122/76 | HR 66 | Temp 98.0°F | Resp 16 | Ht 70.0 in | Wt 176.0 lb

## 2016-03-19 DIAGNOSIS — H9319 Tinnitus, unspecified ear: Secondary | ICD-10-CM | POA: Insufficient documentation

## 2016-03-19 DIAGNOSIS — Z0001 Encounter for general adult medical examination with abnormal findings: Secondary | ICD-10-CM | POA: Diagnosis not present

## 2016-03-19 DIAGNOSIS — H9313 Tinnitus, bilateral: Secondary | ICD-10-CM

## 2016-03-19 DIAGNOSIS — Z0000 Encounter for general adult medical examination without abnormal findings: Secondary | ICD-10-CM

## 2016-03-19 DIAGNOSIS — I499 Cardiac arrhythmia, unspecified: Secondary | ICD-10-CM | POA: Diagnosis not present

## 2016-03-19 DIAGNOSIS — Z23 Encounter for immunization: Secondary | ICD-10-CM

## 2016-03-19 LAB — LIPID PANEL
CHOL/HDL RATIO: 4
Cholesterol: 248 mg/dL — ABNORMAL HIGH (ref 0–200)
HDL: 65.9 mg/dL (ref >39.00)
LDL CALC: 168 mg/dL — AB (ref 0–99)
NonHDL: 182.54
TRIGLYCERIDES: 74 mg/dL (ref 0.0–149.0)
VLDL: 14.8 mg/dL (ref 0.0–40.0)

## 2016-03-19 LAB — COMPREHENSIVE METABOLIC PANEL
ALBUMIN: 3.9 g/dL (ref 3.5–5.2)
ALK PHOS: 52 U/L (ref 39–117)
ALT: 14 U/L (ref 0–53)
AST: 16 U/L (ref 0–37)
BILIRUBIN TOTAL: 0.6 mg/dL (ref 0.2–1.2)
BUN: 18 mg/dL (ref 6–23)
CALCIUM: 9.2 mg/dL (ref 8.4–10.5)
CO2: 27 mEq/L (ref 19–32)
CREATININE: 1.04 mg/dL (ref 0.40–1.50)
Chloride: 104 mEq/L (ref 96–112)
GFR: 75.37 mL/min (ref >60.00)
Glucose, Bld: 96 mg/dL (ref 70–99)
Potassium: 4.8 mEq/L (ref 3.5–5.1)
SODIUM: 138 meq/L (ref 135–145)
TOTAL PROTEIN: 6.8 g/dL (ref 6.0–8.3)

## 2016-03-19 LAB — CBC
HCT: 41.8 % (ref 39.0–52.0)
Hemoglobin: 14.3 g/dL (ref 13.0–17.0)
MCHC: 34.3 g/dL (ref 30.0–36.0)
MCV: 97 fl (ref 78.0–100.0)
PLATELETS: 267 10*3/uL (ref 150.0–400.0)
RBC: 4.3 Mil/uL (ref 4.22–5.81)
RDW: 12.7 % (ref 11.5–15.5)
WBC: 6 10*3/uL (ref 4.0–10.5)

## 2016-03-19 LAB — PSA: PSA: 2.38 ng/mL (ref 0.10–4.00)

## 2016-03-19 MED ORDER — ACYCLOVIR 400 MG PO TABS: ORAL_TABLET | ORAL | Status: DC

## 2016-03-19 NOTE — Assessment & Plan Note (Signed)
Reviewed and updated patient's medical, surgical, family and social history. Medications and allergies were also reviewed. Basic screenings for depression, activities of daily living, hearing, cognition and safety were performed. Provider list was updated and health plan was provided to the patient.  

## 2016-03-19 NOTE — Progress Notes (Signed)
Subjective:    Patient ID: Gordon Baker, male    DOB: May 18, 1946, 70 y.o.   MRN: [id number]  Chief Complaint  Patient presents with  . cpe    fasting    HPI:  Gordon Baker is a 70 y.o. male who presents today for an annual wellness visit.   1) Health Maintenance -   Diet - Averages about 2-3 meals per day consisting of fruits, vegetables, fish, chicken; Caffeine intake of 3-4 cups per day  Exercise - Raquetball, sail, cycle  2) Preventative Exams / Immunizations:  Dental -- Up to date  Vision -- Up to date   Health Maintenance  Topic Date Due  . Hepatitis C Screening  [birth year]  . PNA vac Low Risk Adult (2 of 2 - PPSV23) 12/27/2014  . TETANUS/TDAP  05/03/2015  . INFLUENZA VACCINE  05/06/2016  . COLONOSCOPY  04/11/2024  . ZOSTAVAX  Completed     Immunization History  Administered Date(s) Administered  . Influenza Whole 10/18/2010  . Influenza,inj,Quad PF,36+ Mos 08/01/2013, 08/11/2014  . Pneumococcal Conjugate-13 12/26/2013  . Pneumococcal Polysaccharide-23 03/19/2016  . Td 05/02/2005  . Tdap 03/19/2016  . Zoster 12/26/2013    RISK FACTORS  Tobacco History  Smoking status  . Former Smoker -- 0.50 packs/day for 3 years  Smokeless tobacco  . Never Used     Cardiac risk factors: advanced age (older than 63 for men, 40 for women).  Depression Screen   Depression screen [facility] 2/9 03/19/2016  Decreased Interest 0  Down, Depressed, Hopeless 0  PHQ - 2 Score 0     Activities of Daily Living In your present state of health, do you have any difficulty performing the following activities?:  Driving? No Managing money?  No Feeding yourself? No Getting from bed to chair? No Climbing a flight of stairs? No Preparing food and eating?: No Bathing or showering? No Getting dressed: No Getting to the toilet? No Using the toilet: No Moving around from place to place: No In the past year have you fallen or had a near fall?:No   Home Safety Has  smoke detector and wears seat belts. No excess sun exposure. Are there smokers in your home (other than you)?  No Do you feel safe at home?  Yes  Hearing Difficulties: No Do you often ask people to speak up or repeat themselves? No Do you experience ringing or noises in your ears? No  Do you have difficulty understanding soft or whispered voices? No    Cognitive Testing  Alert? Yes   Normal Appearance? Yes  Oriented to person? Yes  Place? Yes   Time? Yes  Recall of three objects?  Yes  Can perform simple calculations? Yes  Displays appropriate judgment? Yes  Can read the correct time from a watch face? Yes  Do you feel that you have a problem with memory? No  Do you often misplace items? No   Advanced Directives have been discussed with the patient? Yes  Current Physicians/Providers and Suppliers  1. Marcos Eke, FNP - Internal Medicine 2. Melvia Heaps, MD - Gastroenterologist  Indicate any recent Medical Services you may have received from other than [facility] providers in the past year (date may be approximate).  All answers were reviewed with the patient and necessary referrals were made:  Jeanine Luz, FNP   03/19/2016    No Known Allergies   Outpatient Prescriptions Prior to Visit  Medication Sig Dispense Refill  . Glucos-Chondroit-Hyaluron-MSM (GLUCOSAMINE CHONDROITIN JOINT  PO) Take by mouth daily.    Marland Kitchen. KRILL OIL 1000 MG CAPS Take 2 capsules by mouth daily.     . multivitamin (THERAGRAN) per tablet Take 1 tablet by mouth daily.      . vardenafil (LEVITRA) 20 MG tablet Take 1 tablet (20 mg total) by mouth daily as needed. 10 tablet 5  . acyclovir (ZOVIRAX) 400 MG tablet TAKE 1 TABLET (400 MG TOTAL) BY MOUTH 3 (THREE) TIMES DAILY AS NEEDED. 30 tablet 1  . meloxicam (MOBIC) 15 MG tablet Take 15 mg by mouth daily.     Marland Kitchen. aspirin 81 MG tablet Take 81 mg by mouth daily.      . rosuvastatin (CRESTOR) 20 MG tablet Take 1 tablet (20 mg total) by mouth once a week. 1 on friday     . zolpidem (AMBIEN) 5 MG tablet Take 1 tablet (5 mg total) by mouth at bedtime as needed for sleep. 30 tablet 0   No facility-administered medications prior to visit.     Past Medical History  Diagnosis Date  . Hyperlipidemia   . Allergy      Past Surgical History  Procedure Laterality Date  . Tonsillectomy    . Knee arthroscopy      left knee December 2013     Family History  Problem Relation Age of Onset  . Alzheimer's disease Mother     3880  . Breast cancer Mother   . Alcohol abuse Father   . Heart disease Father   . Colon cancer Neg Hx   . Esophageal cancer Neg Hx   . Stomach cancer Neg Hx   . Rectal cancer Neg Hx   . Breast cancer Sister      Social History   Social History  . Marital Status: Married    Spouse Name: N/A  . Number of Children: 1  . Years of Education: 7218   Occupational History  . actor,director,teacher [employer]    and [employer]   Social History Main Topics  . Smoking status: Former Smoker -- 0.50 packs/day for 3 years  . Smokeless tobacco: Never Used  . Alcohol Use: 4.2 oz/week    7 Cans of beer per week     Comment: beer almost every night  . Drug Use: No  . Sexual Activity: Yes   Other Topics Concern  . Not on file   Social History Narrative   Fun: Racquetball, sail, travel     Review of Systems  Constitutional: Denies fever, chills, fatigue, or significant weight gain/loss. HENT: Head: Denies headache or neck pain Ears: Denies changes in hearing, ringing in ears, earache, drainage Positive for tinnitus Nose: Denies discharge, stuffiness, itching, nosebleed, sinus pain Throat: Denies sore throat, hoarseness, dry mouth, sores, thrush Eyes: Denies loss/changes in vision, pain, redness, blurry/double vision, flashing lights Cardiovascular: Denies chest pain/discomfort, tightness, palpitations, shortness of breath with activity, difficulty lying down, swelling, sudden awakening with shortness of breath Respiratory:  Denies shortness of breath, cough, sputum production, wheezing Gastrointestinal: Denies dysphasia, heartburn, change in appetite, nausea, change in bowel habits, rectal bleeding, constipation, diarrhea, yellow skin or eyes Genitourinary: Denies frequency, urgency, burning/pain, blood in urine, incontinence, change in urinary strength. Musculoskeletal: Denies muscle/joint pain, stiffness, back pain, redness or swelling of joints, trauma Skin: Denies rashes, lumps, itching, dryness, color changes, or hair/nail changes Neurological: Denies dizziness, fainting, seizures, weakness, numbness, tingling, tremor Psychiatric - Denies nervousness, stress, depression or memory loss Endocrine: Denies heat or cold intolerance, sweating, frequent urination, excessive  thirst, changes in appetite Hematologic: Denies ease of bruising or bleeding    Objective:     BP 122/76 mmHg  Pulse 66  Temp(Src) 98 F (36.7 C) (Oral)  Resp 16  Ht 5\' 10"  (1.778 m)  Wt 176 lb (79.833 kg)  BMI 25.25 kg/m2  SpO2 97% Nursing note and vital signs reviewed.  Physical Exam  Constitutional: He is oriented to person, place, and time. He appears well-developed and well-nourished.  HENT:  Head: Normocephalic.  Right Ear: Hearing, tympanic membrane, external ear and ear canal normal.  Left Ear: Hearing, tympanic membrane, external ear and ear canal normal.  Nose: Nose normal.  Mouth/Throat: Uvula is midline, oropharynx is clear and moist and mucous membranes are normal.  Eyes: Conjunctivae and EOM are normal. Pupils are equal, round, and reactive to light.  Neck: Neck supple. No JVD present. No tracheal deviation present. No thyromegaly present.  Cardiovascular: Normal rate, normal heart sounds and intact distal pulses.  An irregularly irregular rhythm present.  Pulmonary/Chest: Effort normal and breath sounds normal.  Abdominal: Soft. Bowel sounds are normal. He exhibits no distension and no mass. There is no tenderness.  There is no rebound and no guarding.  Musculoskeletal: Normal range of motion. He exhibits no edema or tenderness.  Lymphadenopathy:    He has no cervical adenopathy.  Neurological: He is alert and oriented to person, place, and time. He has normal reflexes. No cranial nerve deficit. He exhibits normal muscle tone. Coordination normal.  Skin: Skin is warm and dry.  Psychiatric: He has a normal mood and affect. His behavior is normal. Judgment and thought content normal.       Assessment & Plan:   During the course of the visit the patient was educated and counseled about appropriate screening and preventive services including:    Pneumococcal vaccine   Influenza vaccine  Td vaccine  Prostate cancer screening  Colorectal cancer screening  Nutrition counseling   Diet review for nutrition referral? Yes ____  Not Indicated _X___   Patient Instructions (the written plan) was given to the patient.  Medicare Attestation I have personally reviewed: The patient's medical and social history Their use of alcohol, tobacco or illicit drugs Their current medications and supplements The patient's functional ability including ADLs,fall risks, home safety risks, cognitive, and hearing and visual impairment Diet and physical activities Evidence for depression or mood disorders  The patient's weight, height, BMI,  have been recorded in the chart.  I have made referrals, counseling, and provided education to the patient based on review of the above and I have provided the patient with a written personalized care plan for preventive services.     Jeanine Luz, FNP   03/19/2016    Problem List Items Addressed This Visit      Other   Encounter for general adult medical examination with abnormal findings    1) Anticipatory Guidance: Discussed importance of wearing a seatbelt while driving and not texting while driving; changing batteries in smoke detector at least once annually; wearing  suntan lotion when outside; eating a balanced and moderate diet; getting physical activity at least 30 minutes per day.  2) Immunizations / Screenings / Labs:  Pneumovax and tetanus updated today. All other immunizations are up-to-date per recommendations. Obtain PSA for prostate cancer screening. Obtain hepatitis C antibody for hepatitis C screening. All other screenings are up-to-date per recommendations. Obtain CBC, CMET, and Lipid profile.   Overall well exam with minimal risk factors for cardiovascular disease  with the exception of hyperlipidemia. Hyperlipidemia is currently managed with Krill oil. He is of good weight. Encouraged to continue healthy lifestyle behaviors and choices. Follow-up prevention exam in 1 year. Follow-up office visit pending blood work as necessary.      Relevant Orders   CBC (Completed)   Comprehensive metabolic panel (Completed)   PSA (Completed)   Lipid panel (Completed)   Hepatitis C antibody   EKG 12-Lead (Completed)   Medicare annual wellness visit, subsequent - Primary    Reviewed and updated patient's medical, surgical, family and social history. Medications and allergies were also reviewed. Basic screenings for depression, activities of daily living, hearing, cognition and safety were performed. Provider list was updated and health plan was provided to the patient.       Tinnitus    New onset transient tendinitis. Ear exam within normal limits. No medications that would result in this. Refer to ear nose and throat for hearing test.      Relevant Orders   Ambulatory referral to ENT   Irregular heartbeat    In office ECG performed in the office consistent with atrial fibrillation. Rate controlled. Refer to cardiology for further assessment and management.       Relevant Orders   Ambulatory referral to Cardiology    Other Visit Diagnoses    Need for 23-polyvalent pneumococcal polysaccharide vaccine        Relevant Orders    Pneumococcal  polysaccharide vaccine 23-valent greater than or equal to 2yo subcutaneous/IM (Completed)    Need for diphtheria-tetanus-pertussis (Tdap) vaccine, adult/adolescent        Relevant Orders    Tdap vaccine greater than or equal to 7yo IM (Completed)

## 2016-03-19 NOTE — Patient Instructions (Addendum)
Thank you for choosing [facility].  Summary/Instructions:  Start taking 325 mg of aspirin daily.  Please stop by the lab on the basement level of the building for your blood work. Your results will be released to MyChart (or called to you) after review, usually within 72 hours after test completion. If any changes need to be made, you will be notified at that same time.  Please stop by radiology on the basement level of the building for your x-rays. Your results will be released to MyChart (or called to you) after review, usually within 72 hours after test completion. If any treatments or changes are necessary, you will be notified at that same time.  Referrals have been made during this visit. You should expect to hear back from our schedulers in about 7-10 days in regards to establishing an appointment with the specialists we discussed.   If your symptoms worsen or fail to improve, please contact our office for further instruction, or in case of emergency go directly to the emergency room at the closest medical facility.   Health Maintenance  Topic Date Due  . Hepatitis C Screening  10-17-1945  . PNA vac Low Risk Adult (2 of 2 - PPSV23) 12/27/2014  . TETANUS/TDAP  05/03/2015  . INFLUENZA VACCINE  05/06/2016  . COLONOSCOPY  04/11/2024  . ZOSTAVAX  Completed    Health Maintenance, Male A healthy lifestyle and preventative care can promote health and wellness.  Maintain regular health, dental, and eye exams.  Eat a healthy diet. Foods like vegetables, fruits, whole grains, low-fat dairy products, and lean protein foods contain the nutrients you need and are low in calories. Decrease your intake of foods high in solid fats, added sugars, and salt. Get information about a proper diet from your health care provider, if necessary.  Regular physical exercise is one of the most important things you can do for your health. Most adults should get at least 150 minutes of  moderate-intensity exercise (any activity that increases your heart rate and causes you to sweat) each week. In addition, most adults need muscle-strengthening exercises on 2 or more days a week.   Maintain a healthy weight. The body mass index (BMI) is a screening tool to identify possible weight problems. It provides an estimate of body fat based on height and weight. Your health care provider can find your BMI and can help you achieve or maintain a healthy weight. For males 20 years and older:  A BMI below 18.5 is considered underweight.  A BMI of 18.5 to 24.9 is normal.  A BMI of 25 to 29.9 is considered overweight.  A BMI of 30 and above is considered obese.  Maintain normal blood lipids and cholesterol by exercising and minimizing your intake of saturated fat. Eat a balanced diet with plenty of fruits and vegetables. Blood tests for lipids and cholesterol should begin at age 69 and be repeated every 5 years. If your lipid or cholesterol levels are high, you are over age 73, or you are at high risk for heart disease, you may need your cholesterol levels checked more frequently.Ongoing high lipid and cholesterol levels should be treated with medicines if diet and exercise are not working.  If you smoke, find out from your health care provider how to quit. If you do not use tobacco, do not start.  Lung cancer screening is recommended for adults aged 55-80 years who are at high risk for developing lung cancer because of a history of  smoking. A yearly low-dose CT scan of the lungs is recommended for people who have at least a 30-pack-year history of smoking and are current smokers or have quit within the past 15 years. A pack year of smoking is smoking an average of 1 pack of cigarettes a day for 1 year (for example, a 30-pack-year history of smoking could mean smoking 1 pack a day for 30 years or 2 packs a day for 15 years). Yearly screening should continue until the smoker has stopped smoking  for at least 15 years. Yearly screening should be stopped for people who develop a health problem that would prevent them from having lung cancer treatment.  If you choose to drink alcohol, do not have more than 2 drinks per day. One drink is considered to be 12 oz (360 mL) of beer, 5 oz (150 mL) of wine, or 1.5 oz (45 mL) of liquor.  Avoid the use of street drugs. Do not share needles with anyone. Ask for help if you need support or instructions about stopping the use of drugs.  High blood pressure causes heart disease and increases the risk of stroke. High blood pressure is more likely to develop in:  People who have blood pressure in the end of the normal range (100-139/85-89 mm Hg).  People who are overweight or obese.  People who are African American.  If you are 89-78 years of age, have your blood pressure checked every 3-5 years. If you are 75 years of age or older, have your blood pressure checked every year. You should have your blood pressure measured twice--once when you are at a hospital or clinic, and once when you are not at a hospital or clinic. Record the average of the two measurements. To check your blood pressure when you are not at a hospital or clinic, you can use:  An automated blood pressure machine at a pharmacy.  A home blood pressure monitor.  If you are 64-68 years old, ask your health care provider if you should take aspirin to prevent heart disease.  Diabetes screening involves taking a blood sample to check your fasting blood sugar level. This should be done once every 3 years after age 90 if you are at a normal weight and without risk factors for diabetes. Testing should be considered at a younger age or be carried out more frequently if you are overweight and have at least 1 risk factor for diabetes.  Colorectal cancer can be detected and often prevented. Most routine colorectal cancer screening begins at the age of 54 and continues through age 15. However,  your health care provider may recommend screening at an earlier age if you have risk factors for colon cancer. On a yearly basis, your health care provider may provide home test kits to check for hidden blood in the stool. A small camera at the end of a tube may be used to directly examine the colon (sigmoidoscopy or colonoscopy) to detect the earliest forms of colorectal cancer. Talk to your health care provider about this at age 42 when routine screening begins. A direct exam of the colon should be repeated every 5-10 years through age 76, unless early forms of precancerous polyps or small growths are found.  People who are at an increased risk for hepatitis B should be screened for this virus. You are considered at high risk for hepatitis B if:  You were born in a country where hepatitis B occurs often. Talk with your health care  provider about which countries are considered high risk.  Your parents were born in a high-risk country and you have not received a shot to protect against hepatitis B (hepatitis B vaccine).  You have HIV or AIDS.  You use needles to inject street drugs.  You live with, or have sex with, someone who has hepatitis B.  You are a man who has sex with other men (MSM).  You get hemodialysis treatment.  You take certain medicines for conditions like cancer, organ transplantation, and autoimmune conditions.  Hepatitis C blood testing is recommended for all people born from [birth year] through 1964 and any individual with known risk factors for hepatitis C.  Healthy men should no longer receive prostate-specific antigen (PSA) blood tests as part of routine cancer screening. Talk to your health care provider about prostate cancer screening.  Testicular cancer screening is not recommended for adolescents or adult males who have no symptoms. Screening includes self-exam, a health care provider exam, and other screening tests. Consult with your health care provider about any  symptoms you have or any concerns you have about testicular cancer.  Practice safe sex. Use condoms and avoid high-risk sexual practices to reduce the spread of sexually transmitted infections (STIs).  You should be screened for STIs, including gonorrhea and chlamydia if:  You are sexually active and are younger than 24 years.  You are older than 24 years, and your health care provider tells you that you are at risk for this type of infection.  Your sexual activity has changed since you were last screened, and you are at an increased risk for chlamydia or gonorrhea. Ask your health care provider if you are at risk.  If you are at risk of being infected with HIV, it is recommended that you take a prescription medicine daily to prevent HIV infection. This is called pre-exposure prophylaxis (PrEP). You are considered at risk if:  You are a man who has sex with other men (MSM).  You are a heterosexual man who is sexually active with multiple partners.  You take drugs by injection.  You are sexually active with a partner who has HIV.  Talk with your health care provider about whether you are at high risk of being infected with HIV. If you choose to begin PrEP, you should first be tested for HIV. You should then be tested every 3 months for as long as you are taking PrEP.  Use sunscreen. Apply sunscreen liberally and repeatedly throughout the day. You should seek shade when your shadow is shorter than you. Protect yourself by wearing long sleeves, pants, a wide-brimmed hat, and sunglasses year round whenever you are outdoors.  Tell your health care provider of new moles or changes in moles, especially if there is a change in shape or color. Also, tell your health care provider if a mole is larger than the size of a pencil eraser.  A one-time screening for abdominal aortic aneurysm (AAA) and surgical repair of large AAAs by ultrasound is recommended for men aged 65-75 years who are current or  former smokers.  Stay current with your vaccines (immunizations).   This information is not intended to replace advice given to you by your health care provider. Make sure you discuss any questions you have with your health care provider.   Document Released: 03/20/2008 Document Revised: 10/13/2014 Document Reviewed: 02/17/2011 Elsevier Interactive Patient Education 2016 Elsevier Inc.  Atrial Fibrillation Atrial fibrillation is a type of irregular or rapid heartbeat (arrhythmia). In  atrial fibrillation, the heart quivers continuously in a chaotic pattern. This occurs when parts of the heart receive disorganized signals that make the heart unable to pump blood normally. This can increase the risk for stroke, heart failure, and other heart-related conditions. There are different types of atrial fibrillation, including:  Paroxysmal atrial fibrillation. This type starts suddenly, and it usually stops on its own shortly after it starts.  Persistent atrial fibrillation. This type often lasts longer than a week. It may stop on its own or with treatment.  Long-lasting persistent atrial fibrillation. This type lasts longer than 12 months.  Permanent atrial fibrillation. This type does not go away. Talk with your health care provider to learn about the type of atrial fibrillation that you have. CAUSES This condition is caused by some heart-related conditions or procedures, including:  A heart attack.  Coronary artery disease.  Heart failure.  Heart valve conditions.  High blood pressure.  Inflammation of the sac that surrounds the heart (pericarditis).  Heart surgery.  Certain heart rhythm disorders, such as Wolf-Parkinson-White syndrome. Other causes include:  Pneumonia.  Obstructive sleep apnea.  Blockage of an artery in the lungs (pulmonary embolism, or PE).  Lung cancer.  Chronic lung disease.  Thyroid problems, especially if the thyroid is overactive  (hyperthyroidism).  Caffeine.  Excessive alcohol use or illegal drug use.  Use of some medicines, including certain decongestants and diet pills. Sometimes, the cause cannot be found. RISK FACTORS This condition is more likely to develop in:  People who are older in age.  People who smoke.  People who have diabetes mellitus.  People who are overweight (obese).  Athletes who exercise vigorously. SYMPTOMS Symptoms of this condition include:  A feeling that your heart is beating rapidly or irregularly.  A feeling of discomfort or pain in your chest.  Shortness of breath.  Sudden light-headedness or weakness.  Getting tired easily during exercise. In some cases, there are no symptoms. DIAGNOSIS Your health care provider may be able to detect atrial fibrillation when taking your pulse. If detected, this condition may be diagnosed with:  An electrocardiogram (ECG).  A Holter monitor test that records your heartbeat patterns over a 24-hour period.  Transthoracic echocardiogram (TTE) to evaluate how blood flows through your heart.  Transesophageal echocardiogram (TEE) to view more detailed images of your heart.  A stress test.  Imaging tests, such as a CT scan or chest X-ray.  Blood tests. TREATMENT The main goals of treatment are to prevent blood clots from forming and to keep your heart beating at a normal rate and rhythm. The type of treatment that you receive depends on many factors, such as your underlying medical conditions and how you feel when you are experiencing atrial fibrillation. This condition may be treated with:  Medicine to slow down the heart rate, bring the heart's rhythm back to normal, or prevent clots from forming.  Electrical cardioversion. This is a procedure that resets your heart's rhythm by delivering a controlled, low-energy shock to the heart through your skin.  Different types of ablation, such as catheter ablation, catheter ablation with  pacemaker, or surgical ablation. These procedures destroy the heart tissues that send abnormal signals. When the pacemaker is used, it is placed under your skin to help your heart beat in a regular rhythm. HOME CARE INSTRUCTIONS  Take over-the counter and prescription medicines only as told by your health care provider.  If your health care provider prescribed a blood-thinning medicine (anticoagulant), take it exactly  as told. Taking too much blood-thinning medicine can cause bleeding. If you do not take enough blood-thinning medicine, you will not have the protection that you need against stroke and other problems.  Do not use tobacco products, including cigarettes, chewing tobacco, and e-cigarettes. If you need help quitting, ask your health care provider.  If you have obstructive sleep apnea, manage your condition as told by your health care provider.  Do not drink alcohol.  Do not drink beverages that contain caffeine, such as coffee, soda, and tea.  Maintain a healthy weight. Do not use diet pills unless your health care provider approves. Diet pills may make heart problems worse.  Follow diet instructions as told by your health care provider.  Exercise regularly as told by your health care provider.  Keep all follow-up visits as told by your health care provider. This is important. PREVENTION  Avoid drinking beverages that contain caffeine or alcohol.  Avoid certain medicines, especially medicines that are used for breathing problems.  Avoid certain herbs and herbal medicines, such as those that contain ephedra or ginseng.  Do not use illegal drugs, such as cocaine and amphetamines.  Do not smoke.  Manage your high blood pressure. SEEK MEDICAL CARE IF:  You notice a change in the rate, rhythm, or strength of your heartbeat.  You are taking an anticoagulant and you notice increased bruising.  You tire more easily when you exercise or exert yourself. SEEK IMMEDIATE  MEDICAL CARE IF:  You have chest pain, abdominal pain, sweating, or weakness.  You feel nauseous.  You notice blood in your vomit, bowel movement, or urine.  You have shortness of breath.  You suddenly have swollen feet and ankles.  You feel dizzy.  You have sudden weakness or numbness of the face, arm, or leg, especially on one side of the body.  You have trouble speaking, trouble understanding, or both (aphasia).  Your face or your eyelid droops on one side. These symptoms may represent a serious problem that is an emergency. Do not wait to see if the symptoms will go away. Get medical help right away. Call your local emergency services (911 in the [place] Do not drive yourself to the hospital.   This information is not intended to replace advice given to you by your health care provider. Make sure you discuss any questions you have with your health care provider.   Document Released: 09/22/2005 Document Revised: 06/13/2015 Document Reviewed: 01/17/2015 Elsevier Interactive Patient Education [employer]2016 Elsevier Inc.

## 2016-03-19 NOTE — Assessment & Plan Note (Signed)
New onset transient tendinitis. Ear exam within normal limits. No medications that would result in this. Refer to ear nose and throat for hearing test.

## 2016-03-19 NOTE — Assessment & Plan Note (Signed)
In office ECG performed in the office consistent with atrial fibrillation. Rate controlled. Refer to cardiology for further assessment and management.

## 2016-03-19 NOTE — Assessment & Plan Note (Addendum)
1) Anticipatory Guidance: Discussed importance of wearing a seatbelt while driving and not texting while driving; changing batteries in smoke detector at least once annually; wearing suntan lotion when outside; eating a balanced and moderate diet; getting physical activity at least 30 minutes per day.  2) Immunizations / Screenings / Labs:  Pneumovax and tetanus updated today. All other immunizations are up-to-date per recommendations. Obtain PSA for prostate cancer screening. Obtain hepatitis C antibody for hepatitis C screening. All other screenings are up-to-date per recommendations. Obtain CBC, CMET, and Lipid profile.   Overall well exam with minimal risk factors for cardiovascular disease with the exception of hyperlipidemia. Hyperlipidemia is currently managed with Krill oil. He is of good weight. Encouraged to continue healthy lifestyle behaviors and choices. Follow-up prevention exam in 1 year. Follow-up office visit pending blood work as necessary.

## 2016-03-19 NOTE — Progress Notes (Signed)
Pre visit review using our clinic review tool, if applicable. No additional management support is needed unless otherwise documented below in the visit note. 

## 2016-03-20 ENCOUNTER — Encounter: Payer: Self-pay | Admitting: Family

## 2016-03-20 LAB — HEPATITIS C ANTIBODY: HCV AB: NEGATIVE

## 2016-03-27 ENCOUNTER — Encounter: Payer: Self-pay | Admitting: Family

## 2016-05-19 ENCOUNTER — Encounter: Payer: Self-pay | Admitting: Interventional Cardiology

## 2016-05-19 ENCOUNTER — Ambulatory Visit (INDEPENDENT_AMBULATORY_CARE_PROVIDER_SITE_OTHER): Payer: Medicare Other | Admitting: Interventional Cardiology

## 2016-05-19 VITALS — BP 108/60 | HR 64 | Ht 70.0 in | Wt 173.8 lb

## 2016-05-19 DIAGNOSIS — R072 Precordial pain: Secondary | ICD-10-CM | POA: Diagnosis not present

## 2016-05-19 DIAGNOSIS — I480 Paroxysmal atrial fibrillation: Secondary | ICD-10-CM | POA: Diagnosis not present

## 2016-05-19 DIAGNOSIS — I4891 Unspecified atrial fibrillation: Secondary | ICD-10-CM | POA: Insufficient documentation

## 2016-05-19 DIAGNOSIS — E785 Hyperlipidemia, unspecified: Secondary | ICD-10-CM

## 2016-05-19 LAB — TSH: TSH: 0.83 mIU/L (ref 0.40–4.50)

## 2016-05-19 MED ORDER — ROSUVASTATIN CALCIUM 10 MG PO TABS: ORAL_TABLET | ORAL | 3 refills | Status: DC

## 2016-05-19 NOTE — Progress Notes (Signed)
Cardiology Office Note   Date:  05/19/2016   ID:  Gordon Baker, DOB [birth year], MRN [id number]  PCP:  Jeanine Luzalone, Gregory, FNP    No chief complaint on file. Atrial fibrillation   Wt Readings from Last 3 Encounters:  05/19/16 173 lb 12.8 oz (78.8 kg)  03/19/16 176 lb (79.8 kg)  08/11/14 174 lb 12.8 oz (79.3 kg)       History of Present Illness: [name] is a 70 y.o. male  who does not have much of a medical history. He had a routine ECG and was found to be in atrial fibrillation back in June. He was asymptomatic at the time. He was maintained on aspirin. He denies any palpitations. He occasionally has chest pain. This is worse with emotional stress when he is sitting at his desk at work. No relation to exertion. He plays racquetball at a high level regularly and has no problems.  No bleeding problems. No hypertension, diabetes, prior structural heart disease, prior stroke he does not smoke. No cardiac history in his family. No prior thromboembolism.    Past Medical History:  Diagnosis Date  . Allergy   . Hyperlipidemia     Past Surgical History:  Procedure Laterality Date  . KNEE ARTHROSCOPY     left knee December 2013  . TONSILLECTOMY       Current Outpatient Prescriptions  Medication Sig Dispense Refill  . acyclovir (ZOVIRAX) 400 MG tablet Take 400 mg by mouth 3 (three) times daily as needed (OUTBREAKS).     Marland Kitchen. aspirin EC 81 MG tablet Take 81 mg by mouth 3 (three) times daily.    . Glucos-Chondroit-Hyaluron-MSM (GLUCOSAMINE CHONDROITIN JOINT PO) Take 1 tablet by mouth daily.     Marland Kitchen. KRILL OIL 1000 MG CAPS Take 2 capsules by mouth daily.     . multivitamin (THERAGRAN) per tablet Take 1 tablet by mouth daily.      . vardenafil (LEVITRA) 20 MG tablet Take 20 mg by mouth daily as needed for erectile dysfunction.     No current facility-administered medications for this visit.     Allergies:   Review of patient's allergies indicates no known allergies.     Social History:  The patient  reports that he has quit smoking. He has a 1.50 pack-year smoking history. He has never used smokeless tobacco. He reports that he drinks about 4.2 oz of alcohol per week . He reports that he does not use drugs.   Family History:  The patient's family history includes Alcohol abuse in his father; Alzheimer's disease in his mother; Breast cancer in his mother and sister; Heart disease in his father.    ROS:  Please see the history of present illness.   Otherwise, review of systems are positive for chest pain as noted above.   All other systems are reviewed and negative.    PHYSICAL EXAM: VS:  BP 108/60   Pulse 64   Ht 5\' 10"  (1.778 m)   Wt 173 lb 12.8 oz (78.8 kg)   BMI 24.94 kg/m  , BMI Body mass index is 24.94 kg/m. GEN: Well nourished, well developed, in no acute distress  HEENT: normal  Neck: no JVD, carotid bruits, or masses Cardiac: *RRR; no murmurs, rubs, or gallops,no edema  Respiratory:  clear to auscultation bilaterally, normal work of breathing GI: soft, nontender, nondistended, + BS MS: no deformity or atrophy  Skin: warm and dry, no rash Neuro:  Strength and sensation are intact  Psych: euthymic mood, full affect   EKG:   The ekg ordered today demonstrates sinus bradycardia   Recent Labs: 03/19/2016: ALT 14; BUN 18; Creatinine, Ser 1.04; Hemoglobin 14.3; Platelets 267.0; Potassium 4.8; Sodium 138   Lipid Panel    Component Value Date/Time   CHOL 248 (H) 03/19/2016 0952   TRIG 74.0 03/19/2016 0952   HDL 65.90 03/19/2016 0952   CHOLHDL 4 03/19/2016 0952   VLDL 14.8 03/19/2016 0952   LDLCALC 168 (H) 03/19/2016 0952   LDLDIRECT 116.8 03/26/2012 0907     Other studies Reviewed: Additional studies/ records that were reviewed today with results demonstrating: old ECG.   ASSESSMENT AND PLAN:  1. PAF: Appears to be in sinus today. Patient with paroxysmal atrial fibrillation. There is an ECG showing atrial fibrillation on June  14. He has been maintained on aspirin since that time. His lab work was essentially normal except for elevated cholesterol. TSH was not checked. We will check a TSH today.   Check echocardiogram to evaluate for any structural heart disease precipitating his atrial fibrillation. Continue aspirin. His chads score is only 1 so he does not require anticoagulation. 2. Hyperlipidemia: LDL still above 160 despite regular exercise. Restart Crestor 10 mg daily. Recheck lipids and liver tests in 3 months. He still has some Crestor from when he was taking it before. 3. Chest pain: He has some very atypical chest pain. Not related to high level exertion. We'll not plan for an ischemic evaluation at this time. Continue to monitor symptoms. He thinks his symptoms are most likely related to stress.  If echo is abnormal, will change strategy.     Current medicines are reviewed at length with the patient today.  The patient concerns regarding his medicines were addressed.  The following changes have been made:  No change  Labs/ tests ordered today include:  No orders of the defined types were placed in this encounter.   Recommend 150 minutes/week of aerobic exercise Low fat, low carb, high fiber diet recommended  Disposition:   FU in 4 months   Signed, Lance MussJayadeep Lassie Demorest, MD  05/19/2016 2:50 PM    [facility] [address], [place], [place]  [address] Phone: [phone]; Fax: [phone]

## 2016-05-19 NOTE — Patient Instructions (Signed)
Medication Instructions:  Start taking Crestor 10 mg once a week. Continue to take Aspirin. All other medications are the same.  Labwork: Today and in 3 months. Please do not eat or drink after midnight the night before labs are drawn.  Testing/Procedures: Your physician has requested that you have an echocardiogram. Echocardiography is a painless test that uses sound waves to create images of your heart. It provides your doctor with information about the size and shape of your heart and how well your heart's chambers and valves are working. This procedure takes approximately one hour. There are no restrictions for this procedure.   Follow-Up: Your physician recommends that you schedule a follow-up appointment in: 4 months     If you need a refill on your cardiac medications before your next appointment, please call your pharmacy.

## 2016-05-27 ENCOUNTER — Other Ambulatory Visit: Payer: Self-pay

## 2016-05-27 ENCOUNTER — Ambulatory Visit (HOSPITAL_COMMUNITY): Payer: Medicare Other | Attending: Interventional Cardiology

## 2016-05-27 DIAGNOSIS — I358 Other nonrheumatic aortic valve disorders: Secondary | ICD-10-CM | POA: Insufficient documentation

## 2016-05-27 DIAGNOSIS — E785 Hyperlipidemia, unspecified: Secondary | ICD-10-CM | POA: Diagnosis not present

## 2016-05-27 DIAGNOSIS — I480 Paroxysmal atrial fibrillation: Secondary | ICD-10-CM | POA: Diagnosis not present

## 2016-05-27 DIAGNOSIS — I4891 Unspecified atrial fibrillation: Secondary | ICD-10-CM | POA: Diagnosis present

## 2016-05-27 DIAGNOSIS — Z87891 Personal history of nicotine dependence: Secondary | ICD-10-CM | POA: Insufficient documentation

## 2016-08-18 ENCOUNTER — Other Ambulatory Visit: Payer: Medicare Other

## 2016-09-16 DIAGNOSIS — H43812 Vitreous degeneration, left eye: Secondary | ICD-10-CM | POA: Diagnosis not present

## 2016-09-21 NOTE — Progress Notes (Signed)
Cardiology Office Note   Date:  09/23/2016   ID:  Gordon Baker, DOB [birth year], MRN [id number]  PCP:  Jeanine Luzalone, Gregory, FNP    No chief complaint on file. Atrial fibrillation   Wt Readings from Last 3 Encounters:  09/23/16 177 lb (80.3 kg)  05/19/16 173 lb 12.8 oz (78.8 kg)  03/19/16 176 lb (79.8 kg)       History of Present Illness: Gordon Baker is a 70 y.o. male  who does not have much of a medical history. He had a routine ECG and was found to be in atrial fibrillation back in June. He was asymptomatic at the time. He was maintained on aspirin. He denies any palpitations. He occasionally had chest pain back in the summer. This was worse with emotional stress when he is sitting at his desk at work. No relation to exertion. He plays racquetball at a high level regularly and has no problems.  No bleeding problems. No hypertension, diabetes, prior structural heart disease, prior stroke he does not smoke. No cardiac history in his family. No prior thromboembolism.  Since last visit, no palpitations or chest pain.    Past Medical History:  Diagnosis Date  . Allergy   . Hyperlipidemia     Past Surgical History:  Procedure Laterality Date  . KNEE ARTHROSCOPY     left knee December 2013  . TONSILLECTOMY       Current Outpatient Prescriptions  Medication Sig Dispense Refill  . acyclovir (ZOVIRAX) 400 MG tablet Take 400 mg by mouth 3 (three) times daily as needed (OUTBREAKS).     Marland Kitchen. aspirin EC 81 MG tablet Take 81 mg by mouth 3 (three) times daily.    . Glucos-Chondroit-Hyaluron-MSM (GLUCOSAMINE CHONDROITIN JOINT PO) Take 1 tablet by mouth daily.     Marland Kitchen. KRILL OIL 1000 MG CAPS Take 2 capsules by mouth daily.     . multivitamin (THERAGRAN) per tablet Take 1 tablet by mouth daily.      . rosuvastatin (CRESTOR) 10 MG tablet Take 1 tablet once weekly 15 tablet 3  . vardenafil (LEVITRA) 20 MG tablet Take 20 mg by mouth daily as needed for erectile dysfunction.     No  current facility-administered medications for this visit.     Allergies:   Patient has no known allergies.    Social History:  The patient  reports that he has quit smoking. He has a 1.50 pack-year smoking history. He has never used smokeless tobacco. He reports that he drinks about 4.2 oz of alcohol per week . He reports that he does not use drugs.   Family History:  The patient's family history includes Alcohol abuse in his father; Alzheimer's disease in his mother; Breast cancer in his mother and sister; Heart disease in his father.    ROS:  Please see the history of present illness.   Otherwise, review of systems are positive for chest pain as noted above.   All other systems are reviewed and negative.    PHYSICAL EXAM: VS:  BP 126/76   Pulse 76   Ht 5\' 10"  (1.778 m)   Wt 177 lb (80.3 kg)   BMI 25.40 kg/m  , BMI Body mass index is 25.4 kg/m. GEN: Well nourished, well developed, in no acute distress  HEENT: normal  Neck: no JVD, carotid bruits, or masses Cardiac: *RRR; no murmurs, rubs, or gallops,no edema  Respiratory:  clear to auscultation bilaterally, normal work of breathing GI: soft, nontender, nondistended, +  BS MS: no deformity or atrophy  Skin: warm and dry, no rash Neuro:  Strength and sensation are intact Psych: euthymic mood, full affect   EKG:   The ekg ordered today demonstrates sinus bradycardia   Recent Labs: 03/19/2016: ALT 14; BUN 18; Creatinine, Ser 1.04; Hemoglobin 14.3; Platelets 267.0; Potassium 4.8; Sodium 138 05/19/2016: TSH 0.83   Lipid Panel    Component Value Date/Time   CHOL 248 (H) 03/19/2016 0952   TRIG 74.0 03/19/2016 0952   HDL 65.90 03/19/2016 0952   CHOLHDL 4 03/19/2016 0952   VLDL 14.8 03/19/2016 0952   LDLCALC 168 (H) 03/19/2016 0952   LDLDIRECT 116.8 03/26/2012 0907     Other studies Reviewed: Additional studies/ records that were reviewed today with results demonstrating: old ECG.   ASSESSMENT AND PLAN:  1. PAF:  Appears to be in sinus today. Patient with paroxysmal atrial fibrillation months ago. There is an ECG showing atrial fibrillation on June 14. He has been maintained on aspirin since that time. His lab work was essentially normal except for elevated cholesterol. TSH was normal.  Normal echo. Continue aspirin. His chads score is only 1 so he does not require anticoagulation. 2. Hyperlipidemia: LDL was elevated off of Crestor.  Checked by PMD. 3. Chest pain: Resolved.  Normal echo.  Continue exercise.  Let us know if he has more sx.     Current medicines are reviewed at length with the patient today.  The patient concerns regarding his medicines were addressed.  The following changes have been made:  No change  Labs/ tests ordered today include:  No orders of the defined types were placed in this encounter.   Recommend 150 minutes/week of aerobic exercise Low fat, low carb, high fiber diet recommended  Disposition:   FU in 12 months   Signed, Lance MussJayadeep Varanasi, MD  09/23/2016 10:04 AM    [facility] [address], [place], [place]  [address] Phone: [phone]; Fax: [phone]

## 2016-09-23 ENCOUNTER — Ambulatory Visit (INDEPENDENT_AMBULATORY_CARE_PROVIDER_SITE_OTHER): Payer: Medicare Other | Admitting: Interventional Cardiology

## 2016-09-23 ENCOUNTER — Encounter: Payer: Self-pay | Admitting: Interventional Cardiology

## 2016-09-23 VITALS — BP 126/76 | HR 76 | Ht 70.0 in | Wt 177.0 lb

## 2016-09-23 DIAGNOSIS — I480 Paroxysmal atrial fibrillation: Secondary | ICD-10-CM | POA: Diagnosis not present

## 2016-09-23 DIAGNOSIS — E782 Mixed hyperlipidemia: Secondary | ICD-10-CM | POA: Diagnosis not present

## 2016-09-23 NOTE — Patient Instructions (Signed)
Medication Instructions:  Your physician recommends that you continue on your current medications as directed. Please refer to the Current Medication list given to you today.   Labwork: None ordered  Testing/Procedures: None ordered  Follow-Up: Your physician wants you to follow-up in: 12 months with Dr Lyn HollingsheadVaranasi You will receive a reminder letter in the mail two months in advance. If you don't receive a letter, please call our office to schedule the follow-up appointment.   Any Other Special Instructions Will Be Listed Below (If Applicable).     If you need a refill on your cardiac medications before your next appointment, please call your pharmacy.

## 2016-11-25 DIAGNOSIS — H43812 Vitreous degeneration, left eye: Secondary | ICD-10-CM | POA: Diagnosis not present

## 2016-11-25 DIAGNOSIS — H538 Other visual disturbances: Secondary | ICD-10-CM | POA: Diagnosis not present

## 2016-12-21 DIAGNOSIS — S86011A Strain of right Achilles tendon, initial encounter: Secondary | ICD-10-CM | POA: Diagnosis not present

## 2016-12-22 DIAGNOSIS — S86011A Strain of right Achilles tendon, initial encounter: Secondary | ICD-10-CM | POA: Diagnosis not present

## 2016-12-25 DIAGNOSIS — S86011A Strain of right Achilles tendon, initial encounter: Secondary | ICD-10-CM | POA: Diagnosis not present

## 2016-12-25 DIAGNOSIS — G8918 Other acute postprocedural pain: Secondary | ICD-10-CM | POA: Diagnosis not present

## 2016-12-25 DIAGNOSIS — M66361 Spontaneous rupture of flexor tendons, right lower leg: Secondary | ICD-10-CM | POA: Diagnosis not present

## 2017-01-07 DIAGNOSIS — S86011D Strain of right Achilles tendon, subsequent encounter: Secondary | ICD-10-CM | POA: Diagnosis not present

## 2017-02-04 DIAGNOSIS — S86011D Strain of right Achilles tendon, subsequent encounter: Secondary | ICD-10-CM | POA: Diagnosis not present

## 2017-02-06 DIAGNOSIS — M25671 Stiffness of right ankle, not elsewhere classified: Secondary | ICD-10-CM | POA: Diagnosis not present

## 2017-02-06 DIAGNOSIS — S86001D Unspecified injury of right Achilles tendon, subsequent encounter: Secondary | ICD-10-CM | POA: Diagnosis not present

## 2017-02-06 DIAGNOSIS — R262 Difficulty in walking, not elsewhere classified: Secondary | ICD-10-CM | POA: Diagnosis not present

## 2017-02-06 DIAGNOSIS — M25571 Pain in right ankle and joints of right foot: Secondary | ICD-10-CM | POA: Diagnosis not present

## 2017-02-11 DIAGNOSIS — M25671 Stiffness of right ankle, not elsewhere classified: Secondary | ICD-10-CM | POA: Diagnosis not present

## 2017-02-11 DIAGNOSIS — S86001D Unspecified injury of right Achilles tendon, subsequent encounter: Secondary | ICD-10-CM | POA: Diagnosis not present

## 2017-02-11 DIAGNOSIS — R262 Difficulty in walking, not elsewhere classified: Secondary | ICD-10-CM | POA: Diagnosis not present

## 2017-02-11 DIAGNOSIS — M25571 Pain in right ankle and joints of right foot: Secondary | ICD-10-CM | POA: Diagnosis not present

## 2017-02-13 DIAGNOSIS — S83001D Unspecified subluxation of right patella, subsequent encounter: Secondary | ICD-10-CM | POA: Diagnosis not present

## 2017-02-13 DIAGNOSIS — R262 Difficulty in walking, not elsewhere classified: Secondary | ICD-10-CM | POA: Diagnosis not present

## 2017-02-13 DIAGNOSIS — M25671 Stiffness of right ankle, not elsewhere classified: Secondary | ICD-10-CM | POA: Diagnosis not present

## 2017-02-13 DIAGNOSIS — M25571 Pain in right ankle and joints of right foot: Secondary | ICD-10-CM | POA: Diagnosis not present

## 2017-02-17 DIAGNOSIS — M25571 Pain in right ankle and joints of right foot: Secondary | ICD-10-CM | POA: Diagnosis not present

## 2017-02-17 DIAGNOSIS — R262 Difficulty in walking, not elsewhere classified: Secondary | ICD-10-CM | POA: Diagnosis not present

## 2017-02-17 DIAGNOSIS — M25671 Stiffness of right ankle, not elsewhere classified: Secondary | ICD-10-CM | POA: Diagnosis not present

## 2017-02-17 DIAGNOSIS — S83001D Unspecified subluxation of right patella, subsequent encounter: Secondary | ICD-10-CM | POA: Diagnosis not present

## 2017-02-19 DIAGNOSIS — R262 Difficulty in walking, not elsewhere classified: Secondary | ICD-10-CM | POA: Diagnosis not present

## 2017-02-19 DIAGNOSIS — S86001D Unspecified injury of right Achilles tendon, subsequent encounter: Secondary | ICD-10-CM | POA: Diagnosis not present

## 2017-02-19 DIAGNOSIS — M25571 Pain in right ankle and joints of right foot: Secondary | ICD-10-CM | POA: Diagnosis not present

## 2017-02-19 DIAGNOSIS — M25671 Stiffness of right ankle, not elsewhere classified: Secondary | ICD-10-CM | POA: Diagnosis not present

## 2017-02-24 DIAGNOSIS — R262 Difficulty in walking, not elsewhere classified: Secondary | ICD-10-CM | POA: Diagnosis not present

## 2017-02-24 DIAGNOSIS — M25671 Stiffness of right ankle, not elsewhere classified: Secondary | ICD-10-CM | POA: Diagnosis not present

## 2017-02-24 DIAGNOSIS — M25571 Pain in right ankle and joints of right foot: Secondary | ICD-10-CM | POA: Diagnosis not present

## 2017-02-24 DIAGNOSIS — S86001D Unspecified injury of right Achilles tendon, subsequent encounter: Secondary | ICD-10-CM | POA: Diagnosis not present

## 2017-02-26 DIAGNOSIS — S86001D Unspecified injury of right Achilles tendon, subsequent encounter: Secondary | ICD-10-CM | POA: Diagnosis not present

## 2017-02-26 DIAGNOSIS — M25671 Stiffness of right ankle, not elsewhere classified: Secondary | ICD-10-CM | POA: Diagnosis not present

## 2017-02-26 DIAGNOSIS — M25571 Pain in right ankle and joints of right foot: Secondary | ICD-10-CM | POA: Diagnosis not present

## 2017-02-26 DIAGNOSIS — R262 Difficulty in walking, not elsewhere classified: Secondary | ICD-10-CM | POA: Diagnosis not present

## 2017-03-04 DIAGNOSIS — S86001D Unspecified injury of right Achilles tendon, subsequent encounter: Secondary | ICD-10-CM | POA: Diagnosis not present

## 2017-03-04 DIAGNOSIS — M25671 Stiffness of right ankle, not elsewhere classified: Secondary | ICD-10-CM | POA: Diagnosis not present

## 2017-03-04 DIAGNOSIS — R262 Difficulty in walking, not elsewhere classified: Secondary | ICD-10-CM | POA: Diagnosis not present

## 2017-03-04 DIAGNOSIS — M25571 Pain in right ankle and joints of right foot: Secondary | ICD-10-CM | POA: Diagnosis not present

## 2017-03-16 DIAGNOSIS — M25671 Stiffness of right ankle, not elsewhere classified: Secondary | ICD-10-CM | POA: Diagnosis not present

## 2017-03-16 DIAGNOSIS — R262 Difficulty in walking, not elsewhere classified: Secondary | ICD-10-CM | POA: Diagnosis not present

## 2017-03-16 DIAGNOSIS — M25571 Pain in right ankle and joints of right foot: Secondary | ICD-10-CM | POA: Diagnosis not present

## 2017-03-16 DIAGNOSIS — S86001D Unspecified injury of right Achilles tendon, subsequent encounter: Secondary | ICD-10-CM | POA: Diagnosis not present

## 2017-03-17 DIAGNOSIS — H52203 Unspecified astigmatism, bilateral: Secondary | ICD-10-CM | POA: Diagnosis not present

## 2017-03-17 DIAGNOSIS — H5203 Hypermetropia, bilateral: Secondary | ICD-10-CM | POA: Diagnosis not present

## 2017-03-17 DIAGNOSIS — H524 Presbyopia: Secondary | ICD-10-CM | POA: Diagnosis not present

## 2017-03-18 DIAGNOSIS — S86001D Unspecified injury of right Achilles tendon, subsequent encounter: Secondary | ICD-10-CM | POA: Diagnosis not present

## 2017-03-20 ENCOUNTER — Encounter: Payer: Medicare Other | Admitting: Family

## 2017-03-25 ENCOUNTER — Other Ambulatory Visit (INDEPENDENT_AMBULATORY_CARE_PROVIDER_SITE_OTHER): Payer: Medicare Other

## 2017-03-25 ENCOUNTER — Ambulatory Visit (INDEPENDENT_AMBULATORY_CARE_PROVIDER_SITE_OTHER): Payer: Medicare Other | Admitting: Family

## 2017-03-25 ENCOUNTER — Encounter: Payer: Self-pay | Admitting: Family

## 2017-03-25 VITALS — BP 110/72 | HR 56 | Temp 98.4°F | Ht 70.0 in | Wt 180.0 lb

## 2017-03-25 DIAGNOSIS — Z0001 Encounter for general adult medical examination with abnormal findings: Secondary | ICD-10-CM

## 2017-03-25 DIAGNOSIS — S86091D Other specified injury of right Achilles tendon, subsequent encounter: Secondary | ICD-10-CM

## 2017-03-25 DIAGNOSIS — Z0000 Encounter for general adult medical examination without abnormal findings: Secondary | ICD-10-CM

## 2017-03-25 DIAGNOSIS — E785 Hyperlipidemia, unspecified: Secondary | ICD-10-CM

## 2017-03-25 LAB — LIPID PANEL
CHOL/HDL RATIO: 4
Cholesterol: 249 mg/dL — ABNORMAL HIGH (ref 0–200)
HDL: 59.9 mg/dL (ref >39.00)
LDL CALC: 165 mg/dL — AB (ref 0–99)
NONHDL: 189.08
Triglycerides: 121 mg/dL (ref 0.0–149.0)
VLDL: 24.2 mg/dL (ref 0.0–40.0)

## 2017-03-25 LAB — COMPREHENSIVE METABOLIC PANEL
ALK PHOS: 63 U/L (ref 39–117)
ALT: 18 U/L (ref 0–53)
AST: 18 U/L (ref 0–37)
Albumin: 4 g/dL (ref 3.5–5.2)
BUN: 15 mg/dL (ref 6–23)
CHLORIDE: 104 meq/L (ref 96–112)
CO2: 29 meq/L (ref 19–32)
Calcium: 9.8 mg/dL (ref 8.4–10.5)
Creatinine, Ser: 0.97 mg/dL (ref 0.40–1.50)
GFR: 81.44 mL/min (ref >60.00)
GLUCOSE: 106 mg/dL — AB (ref 70–99)
POTASSIUM: 4.5 meq/L (ref 3.5–5.1)
SODIUM: 138 meq/L (ref 135–145)
TOTAL PROTEIN: 6.3 g/dL (ref 6.0–8.3)
Total Bilirubin: 0.8 mg/dL (ref 0.2–1.2)

## 2017-03-25 LAB — CBC
HEMATOCRIT: 41.1 % (ref 39.0–52.0)
Hemoglobin: 14 g/dL (ref 13.0–17.0)
MCHC: 34.2 g/dL (ref 30.0–36.0)
MCV: 98.7 fl (ref 78.0–100.0)
Platelets: 245 10*3/uL (ref 150.0–400.0)
RBC: 4.16 Mil/uL — ABNORMAL LOW (ref 4.22–5.81)
RDW: 13 % (ref 11.5–15.5)
WBC: 5.2 10*3/uL (ref 4.0–10.5)

## 2017-03-25 LAB — PSA: PSA: 1.75 ng/mL (ref 0.10–4.00)

## 2017-03-25 NOTE — Progress Notes (Signed)
Subjective:    Patient ID: Gordon Baker, male    DOB: Sep 30, 1946, 71 y.o.   MRN: [id number]  Chief Complaint  Patient presents with  . Annual Exam    HPI:  Gordon Baker is a 71 y.o. male who presents today for a Medicare Annual Wellness/Physical exam.    1) Health Maintenance -   Diet - Averages about 3 meals per day consisting of a regular diet; Caffeine intake of 2-3 cups per day.   Exercise - limited secondary to Achilles repair; working on improving.   2) Preventative Exams / Immunizations:  Dental -- Up to date   Vision -- Up to date   Health Maintenance  Topic Date Due  . INFLUENZA VACCINE  05/06/2017  . COLONOSCOPY  04/11/2024  . TETANUS/TDAP  03/19/2026  . Hepatitis C Screening  Completed  . PNA vac Low Risk Adult  Completed     Immunization History  Administered Date(s) Administered  . Influenza Whole 10/18/2010  . Influenza,inj,Quad PF,36+ Mos 08/01/2013, 08/11/2014  . Pneumococcal Conjugate-13 12/26/2013  . Pneumococcal Polysaccharide-23 03/19/2016  . Td 05/02/2005  . Tdap 03/19/2016  . Zoster 12/26/2013    RISK FACTORS  Tobacco History  Smoking Status  . Former Smoker  . Packs/day: 0.50  . Years: 3.00  Smokeless Tobacco  . Never Used    Cardiac risk factors: advanced age (older than 66 for men, 47 for women), dyslipidemia and male gender.  Depression Screen  Depression screen [facility] 2/9 03/25/2017  Decreased Interest 0  Down, Depressed, Hopeless 0  PHQ - 2 Score 0     Activities of Daily Living In your present state of health, do you have any difficulty performing the following activities?:  Driving? No Managing money?  No Feeding yourself? No Getting from bed to chair? No Climbing a flight of stairs? No Preparing food and eating?: No Bathing or showering? No Getting dressed: No Getting to the toilet? No Using the toilet: No Moving around from place to place: No In the past year have you fallen or had a near  fall?:No   Home Safety Has smoke detector and wears seat belts. No firearms. No excess sun exposure. Are there smokers in your home (other than you)?  No Do you feel safe at home?  Yes  Hearing Difficulties: No Do you often ask people to speak up or repeat themselves? No Do you experience ringing or noises in your ears? No  Do you have difficulty understanding soft or whispered voices? No    Cognitive Testing  Alert? Yes   Normal Appearance? Yes  Oriented to person? Yes  Place? Yes   Time? Yes  Recall of three objects?  Yes  Can perform simple calculations? Yes  Displays appropriate judgment? Yes  Can read the correct time from a watch face? Yes  Do you feel that you have a problem with memory? No  Do you often misplace items? No   Advanced Directives have been discussed with the patient? Yes   Current Physicians/Providers and Suppliers  1. Marcos Eke, FNP - Internal Medicine 2. Lance Muss, MD - Cardiology 3. Candyce Churn, MD - Opthalmology   Indicate any recent Medical Services you may have received from other than [facility] providers in the past year (date may be approximate).  All answers were reviewed with the patient and necessary referrals were made:  Jeanine Luz, FNP   03/25/2017    No Known Allergies   Outpatient Medications Prior to Visit  Medication Sig Dispense Refill  . acyclovir (ZOVIRAX) 400 MG tablet Take 400 mg by mouth 3 (three) times daily as needed (OUTBREAKS).     Marland Kitchen aspirin EC 81 MG tablet Take 81 mg by mouth 3 (three) times daily.    . Glucos-Chondroit-Hyaluron-MSM (GLUCOSAMINE CHONDROITIN JOINT PO) Take 1 tablet by mouth daily.     Marland Kitchen KRILL OIL 1000 MG CAPS Take 2 capsules by mouth daily.     . multivitamin (THERAGRAN) per tablet Take 1 tablet by mouth daily.      . vardenafil (LEVITRA) 20 MG tablet Take 20 mg by mouth daily as needed for erectile dysfunction.    . rosuvastatin (CRESTOR) 10 MG tablet Take 1 tablet once weekly 15 tablet  3   No facility-administered medications prior to visit.      Past Medical History:  Diagnosis Date  . Achilles tendon rupture   . Allergy   . Hyperlipidemia      Past Surgical History:  Procedure Laterality Date  . Achilles tendone repair    . KNEE ARTHROSCOPY     left knee December 2013  . TONSILLECTOMY       Family History  Problem Relation Age of Onset  . Alzheimer's disease Mother        55  . Breast cancer Mother   . Alcohol abuse Father   . Heart disease Father   . Breast cancer Sister   . Colon cancer Neg Hx   . Esophageal cancer Neg Hx   . Stomach cancer Neg Hx   . Rectal cancer Neg Hx   . Heart attack Neg Hx      Social History   Social History  . Marital status: Married    Spouse name: N/A  . Number of children: 1  . Years of education: 5   Occupational History  . actor,director,teacher [employer]    and [place]   Social History Main Topics  . Smoking status: Former Smoker    Packs/day: 0.50    Years: 3.00  . Smokeless tobacco: Never Used  . Alcohol use 4.2 oz/week    7 Cans of beer per week     Comment: beer almost every night  . Drug use: No  . Sexual activity: Yes   Other Topics Concern  . Not on file   Social History Narrative   Fun: Racquetball, sail, travel     Review of Systems  Constitutional: Denies fever, chills, fatigue, or significant weight gain/loss. HENT: Head: Denies headache or neck pain Ears: Denies changes in hearing, ringing in ears, earache, drainage Nose: Denies discharge, stuffiness, itching, nosebleed, sinus pain Throat: Denies sore throat, hoarseness, dry mouth, sores, thrush Eyes: Denies loss/changes in vision, pain, redness, blurry/double vision, flashing lights Cardiovascular: Denies chest pain/discomfort, tightness, palpitations, shortness of breath with activity, difficulty lying down, swelling, sudden awakening with shortness of breath Respiratory: Denies shortness of breath, cough, sputum  production, wheezing Gastrointestinal: Denies dysphasia, heartburn, change in appetite, nausea, change in bowel habits, rectal bleeding, constipation, diarrhea, yellow skin or eyes Genitourinary: Denies frequency, urgency, burning/pain, blood in urine, incontinence, change in urinary strength. Musculoskeletal: Denies muscle/joint pain, stiffness, back pain, redness or swelling of joints, trauma Skin: Denies rashes, lumps, itching, dryness, color changes, or hair/nail changes Neurological: Denies dizziness, fainting, seizures, weakness, numbness, tingling, tremor Psychiatric - Denies nervousness, stress, depression or memory loss Endocrine: Denies heat or cold intolerance, sweating, frequent urination, excessive thirst, changes in appetite Hematologic: Denies ease of bruising or bleeding  Objective:     BP 110/72 (BP Location: Left Arm, Patient Position: Sitting, Cuff Size: Normal)   Pulse (!) 56   Temp 98.4 F (36.9 C) (Oral)   Ht 5\' 10"  (1.778 m)   Wt 180 lb (81.6 kg)   SpO2 98%   BMI 25.83 kg/m  Nursing note and vital signs reviewed.  Physical Exam  Constitutional: He is oriented to person, place, and time. He appears well-developed and well-nourished.  HENT:  Head: Normocephalic.  Right Ear: Hearing, tympanic membrane, external ear and ear canal normal.  Left Ear: Hearing, tympanic membrane, external ear and ear canal normal.  Nose: Nose normal.  Mouth/Throat: Uvula is midline, oropharynx is clear and moist and mucous membranes are normal.  Eyes: Conjunctivae and EOM are normal. Pupils are equal, round, and reactive to light.  Neck: Neck supple. No JVD present. No tracheal deviation present. No thyromegaly present.  Cardiovascular: Normal rate, regular rhythm, normal heart sounds and intact distal pulses.   Pulmonary/Chest: Effort normal and breath sounds normal.  Abdominal: Soft. Bowel sounds are normal. He exhibits no distension and no mass. There is no tenderness. There is  no rebound and no guarding.  Musculoskeletal: Normal range of motion. He exhibits no tenderness.  Mild/moderate edema of the right ankle/posterior heel with decreased strength and plantar flexion. Negative Thompson test  Lymphadenopathy:    He has no cervical adenopathy.  Neurological: He is alert and oriented to person, place, and time. He has normal reflexes. No cranial nerve deficit. He exhibits normal muscle tone. Coordination normal.  Skin: Skin is warm and dry.  Psychiatric: He has a normal mood and affect. His behavior is normal. Judgment and thought content normal.       Assessment & Plan:   During the course of the visit the patient was educated and counseled about appropriate screening and preventive services including:    Influenza vaccine  Td vaccine  Prostate cancer screening  Colorectal cancer screening  Diabetes screening  Nutrition counseling   Diet review for nutrition referral? Yes ____  Not Indicated _X___   Patient Instructions (the written plan) was given to the patient.  Medicare Attestation I have personally reviewed: The patient's medical and social history Their use of alcohol, tobacco or illicit drugs Their current medications and supplements The patient's functional ability including ADLs,fall risks, home safety risks, cognitive, and hearing and visual impairment Diet and physical activities Evidence for depression or mood disorders  The patient's weight, height, BMI,  have been recorded in the chart.  I have made referrals, counseling, and provided education to the patient based on review of the above and I have provided the patient with a written personalized care plan for preventive services.     Problem List Items Addressed This Visit      Other   Encounter for general adult medical examination with abnormal findings    1) Anticipatory Guidance: Discussed importance of wearing a seatbelt while driving and not texting while driving;  changing batteries in smoke detector at least once annually; wearing suntan lotion when outside; eating a balanced and moderate diet; getting physical activity at least 30 minutes per day.  2) Immunizations / Screenings / Labs:  All immunizations are up-to-date per recommendations. Colon cancer screening is up-to-date. Hepatitis C screening and pneumonia vaccinations are up-to-date. Obtain PSA for prostate cancer screening. All other screenings are up-to-date per recommendations. Obtain CBC, CMET, and lipid profile.    Overall well exam with risk factors for cardiovascular  disease including hyperlipidemia not currently maintained on medication and managed through lifestyle. She is of good weight and eats a balanced, moderate, and varied intake. Exercises regularly although recently limited secondary to Achilles tendon repair on the right side. Continue other healthy lifestyle behaviors and choices. Follow-up prevention exam in 1 year. Follow-up office visit pending blood work and for chronic conditions as needed.       Relevant Orders   CBC   Comprehensive metabolic panel   Lipid panel   PSA   Medicare annual wellness visit, subsequent - Primary    Reviewed and updated patient's medical, surgical, family and social history. Medications and allergies were also reviewed. Basic screenings for depression, activities of daily living, hearing, cognition and safety were performed. Provider list was updated and health plan was provided to the patient.           I have discontinued Mr. Eble rosuvastatin. I am also having him maintain his Krill Oil, multivitamin, Glucos-Chondroit-Hyaluron-MSM (GLUCOSAMINE CHONDROITIN JOINT PO), aspirin EC, acyclovir, and vardenafil.   Follow-up: Return in about 1 year (around 03/25/2018), or if symptoms worsen or fail to improve.   Jeanine Luz, FNP

## 2017-03-25 NOTE — Patient Instructions (Addendum)
Thank you for choosing [facility].  SUMMARY AND INSTRUCTIONS:   Labs:  Please stop by the lab on the lower level of the building for your blood work. Your results will be released to MyChart (or called to you) after review, usually within 72 hours after test completion. If any changes need to be made, you will be notified at that same time.  1.) The lab is open from 7:30am to 5:30 pm Monday-Friday 2.) No appointment is necessary 3.) Fasting (if needed) is 6-8 hours after food and drink; black coffee and water are okay   Follow up:  If your symptoms worsen or fail to improve, please contact our office for further instruction, or in case of emergency go directly to the emergency room at the closest medical facility.   Health Maintenance  Topic Date Due  . INFLUENZA VACCINE  05/06/2017  . COLONOSCOPY  04/11/2024  . TETANUS/TDAP  03/19/2026  . Hepatitis C Screening  Completed  . PNA vac Low Risk Adult  Completed    Health Maintenance, Male A healthy lifestyle and preventive care is important for your health and wellness. Ask your health care provider about what schedule of regular examinations is right for you. What should I know about weight and diet? Eat a Healthy Diet  Eat plenty of vegetables, fruits, whole grains, low-fat dairy products, and lean protein.  Do not eat a lot of foods high in solid fats, added sugars, or salt.  Maintain a Healthy Weight Regular exercise can help you achieve or maintain a healthy weight. You should:  Do at least 150 minutes of exercise each week. The exercise should increase your heart rate and make you sweat (moderate-intensity exercise).  Do strength-training exercises at least twice a week.  Watch Your Levels of Cholesterol and Blood Lipids  Have your blood tested for lipids and cholesterol every 5 years starting at 70 years of age. If you are at high risk for heart disease, you should start having your blood tested when you are 70  years old. You may need to have your cholesterol levels checked more often if: ? Your lipid or cholesterol levels are high. ? You are older than 70 years of age. ? You are at high risk for heart disease.  What should I know about cancer screening? Many types of cancers can be detected early and may often be prevented. Lung Cancer  You should be screened every year for lung cancer if: ? You are a current smoker who has smoked for at least 30 years. ? You are a former smoker who has quit within the past 15 years.  Talk to your health care provider about your screening options, when you should start screening, and how often you should be screened.  Colorectal Cancer  Routine colorectal cancer screening usually begins at 70 years of age and should be repeated every 5-10 years until you are 70 years old. You may need to be screened more often if early forms of precancerous polyps or small growths are found. Your health care provider may recommend screening at an earlier age if you have risk factors for colon cancer.  Your health care provider may recommend using home test kits to check for hidden blood in the stool.  A small camera at the end of a tube can be used to examine your colon (sigmoidoscopy or colonoscopy). This checks for the earliest forms of colorectal cancer.  Prostate and Testicular Cancer  Depending on your age and overall  health, your health care provider may do certain tests to screen for prostate and testicular cancer.  Talk to your health care provider about any symptoms or concerns you have about testicular or prostate cancer.  Skin Cancer  Check your skin from head to toe regularly.  Tell your health care provider about any new moles or changes in moles, especially if: ? There is a change in a mole's size, shape, or color. ? You have a mole that is larger than a pencil eraser.  Always use sunscreen. Apply sunscreen liberally and repeat throughout the  day.  Protect yourself by wearing long sleeves, pants, a wide-brimmed hat, and sunglasses when outside.  What should I know about heart disease, diabetes, and high blood pressure?  If you are 68-66 years of age, have your blood pressure checked every 3-5 years. If you are 58 years of age or older, have your blood pressure checked every year. You should have your blood pressure measured twice-once when you are at a hospital or clinic, and once when you are not at a hospital or clinic. Record the average of the two measurements. To check your blood pressure when you are not at a hospital or clinic, you can use: ? An automated blood pressure machine at a pharmacy. ? A home blood pressure monitor.  Talk to your health care provider about your target blood pressure.  If you are between 81-19 years old, ask your health care provider if you should take aspirin to prevent heart disease.  Have regular diabetes screenings by checking your fasting blood sugar level. ? If you are at a normal weight and have a low risk for diabetes, have this test once every three years after the age of 45. ? If you are overweight and have a high risk for diabetes, consider being tested at a younger age or more often.  A one-time screening for abdominal aortic aneurysm (AAA) by ultrasound is recommended for men aged 65-75 years who are current or former smokers. What should I know about preventing infection? Hepatitis B If you have a higher risk for hepatitis B, you should be screened for this virus. Talk with your health care provider to find out if you are at risk for hepatitis B infection. Hepatitis C Blood testing is recommended for:  Everyone born from [birth year] through 1964.  Anyone with known risk factors for hepatitis C.  Sexually Transmitted Diseases (STDs)  You should be screened each year for STDs including gonorrhea and chlamydia if: ? You are sexually active and are younger than 70 years of age. ? You are  older than 70 years of age and your health care provider tells you that you are at risk for this type of infection. ? Your sexual activity has changed since you were last screened and you are at an increased risk for chlamydia or gonorrhea. Ask your health care provider if you are at risk.  Talk with your health care provider about whether you are at high risk of being infected with HIV. Your health care provider may recommend a prescription medicine to help prevent HIV infection.  What else can I do?  Schedule regular health, dental, and eye exams.  Stay current with your vaccines (immunizations).  Do not use any tobacco products, such as cigarettes, chewing tobacco, and e-cigarettes. If you need help quitting, ask your health care provider.  Limit alcohol intake to no more than 2 drinks per day. One drink equals 12 ounces of beer, 5  ounces of wine, or 1 ounces of hard liquor.  Do not use street drugs.  Do not share needles.  Ask your health care provider for help if you need support or information about quitting drugs.  Tell your health care provider if you often feel depressed.  Tell your health care provider if you have ever been abused or do not feel safe at home. This information is not intended to replace advice given to you by your health care provider. Make sure you discuss any questions you have with your health care provider. Document Released: 03/20/2008 Document Revised: 05/21/2016 Document Reviewed: 06/26/2015 Elsevier Interactive Patient Education  [employer].
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
## 2017-03-25 NOTE — Assessment & Plan Note (Signed)
Reviewed and updated patient's medical, surgical, family and social history. Medications and allergies were also reviewed. Basic screenings for depression, activities of daily living, hearing, cognition and safety were performed. Provider list was updated and health plan was provided to the patient.  

## 2017-03-26 ENCOUNTER — Encounter: Payer: Self-pay | Admitting: Family

## 2017-04-01 MED ORDER — ATORVASTATIN CALCIUM 10 MG PO TABS: 10.0000 mg | ORAL_TABLET | Freq: Every day | ORAL | 0 refills | Status: DC

## 2017-04-18 DIAGNOSIS — R2232 Localized swelling, mass and lump, left upper limb: Secondary | ICD-10-CM | POA: Diagnosis not present

## 2017-04-18 DIAGNOSIS — M79645 Pain in left finger(s): Secondary | ICD-10-CM | POA: Diagnosis not present

## 2017-04-19 ENCOUNTER — Encounter: Payer: Self-pay | Admitting: Family

## 2017-04-21 ENCOUNTER — Encounter: Payer: Self-pay | Admitting: Family

## 2017-04-21 ENCOUNTER — Ambulatory Visit (INDEPENDENT_AMBULATORY_CARE_PROVIDER_SITE_OTHER): Payer: Medicare Other | Admitting: Family

## 2017-04-21 VITALS — BP 112/62 | HR 59 | Resp 16 | Ht 70.0 in | Wt 177.0 lb

## 2017-04-21 DIAGNOSIS — S61219A Laceration without foreign body of unspecified finger without damage to nail, initial encounter: Secondary | ICD-10-CM | POA: Insufficient documentation

## 2017-04-21 DIAGNOSIS — S61219D Laceration without foreign body of unspecified finger without damage to nail, subsequent encounter: Secondary | ICD-10-CM | POA: Diagnosis not present
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
## 2017-04-21 NOTE — Patient Instructions (Signed)
Thank you for choosing [facility].  SUMMARY AND INSTRUCTIONS:  Please continue to take the antibiotic until completed.   Keep the sites clean and dry.  Follow up in about 1 week for stitch removal.   Follow up:  If your symptoms worsen or fail to improve, please contact our office for further instruction, or in case of emergency go directly to the emergency room at the closest medical facility.
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
## 2017-04-28 ENCOUNTER — Ambulatory Visit (INDEPENDENT_AMBULATORY_CARE_PROVIDER_SITE_OTHER): Payer: Medicare Other | Admitting: Family

## 2017-04-28 ENCOUNTER — Encounter: Payer: Self-pay | Admitting: Family

## 2017-04-28 DIAGNOSIS — S61219D Laceration without foreign body of unspecified finger without damage to nail, subsequent encounter: Secondary | ICD-10-CM | POA: Diagnosis not present

## 2017-04-28 NOTE — Assessment & Plan Note (Signed)
Wounds appear with good healing and approximation with no evidence of infection. Sutures removed without complication and post-care instructions provided. Follow up as needed for signs of infection or if complications develop.

## 2017-04-28 NOTE — Progress Notes (Signed)
   Subjective:    Patient ID: Gordon Baker, male    DOB: 1946/09/07, 71 y.o.   MRN: [id number]  Chief Complaint  Patient presents with  . Wound Check  . Suture / Staple Removal    HPI:  Gordon Baker is a 71 y.o. male who  has a past medical history of Achilles tendon rupture; Allergy; and Hyperlipidemia. and presents today for a follow up office visit.   Returns today for wound check and suture removal from previously sustained laceration to his thumb and third finger. Denies fevers. Has been keeping the sites clean and dry and notes no evidence of infection.   No Known Allergies    Outpatient Medications Prior to Visit  Medication Sig Dispense Refill  . acyclovir (ZOVIRAX) 400 MG tablet Take 400 mg by mouth 3 (three) times daily as needed (OUTBREAKS).     Marland Kitchen. aspirin EC 81 MG tablet Take 81 mg by mouth 3 (three) times daily.    Marland Kitchen. atorvastatin (LIPITOR) 10 MG tablet Take 1 tablet (10 mg total) by mouth daily. 90 tablet 0  . Glucos-Chondroit-Hyaluron-MSM (GLUCOSAMINE CHONDROITIN JOINT PO) Take 1 tablet by mouth daily.     Marland Kitchen. KRILL OIL 1000 MG CAPS Take 2 capsules by mouth daily.     . multivitamin (THERAGRAN) per tablet Take 1 tablet by mouth daily.      . vardenafil (LEVITRA) 20 MG tablet Take 20 mg by mouth daily as needed for erectile dysfunction.     No facility-administered medications prior to visit.      Review of Systems  Constitutional: Negative for chills and fever.  Respiratory: Negative for chest tightness and shortness of breath.   Skin: Positive for wound.      Objective:    BP 124/78 (BP Location: Left Arm, Patient Position: Sitting, Cuff Size: Normal)   Pulse 64   Resp 16   Ht 5\' 10"  (1.778 m)   Wt 177 lb (80.3 kg)   SpO2 94%   BMI 25.40 kg/m  Nursing note and vital signs reviewed.  Physical Exam  Constitutional: He is oriented to person, place, and time. He appears well-developed and well-nourished. No distress.  Cardiovascular: Normal rate,  regular rhythm, normal heart sounds and intact distal pulses.   Pulmonary/Chest: Effort normal and breath sounds normal.  Neurological: He is alert and oriented to person, place, and time.  Skin: Skin is warm and dry.  Lacerations appear well approximated with sutures in place and no evidence of redness, swelling or discharge. Appear ready to be removed.   Psychiatric: He has a normal mood and affect. His behavior is normal. Judgment and thought content normal.       Assessment & Plan:   Problem List Items Addressed This Visit      Other   Laceration of fingers without complication    Wounds appear with good healing and approximation with no evidence of infection. Sutures removed without complication and post-care instructions provided. Follow up as needed for signs of infection or if complications develop.           I am having Mr. Baker maintain his [employer] Oil, multivitamin, Glucos-Chondroit-Hyaluron-MSM (GLUCOSAMINE CHONDROITIN JOINT PO), aspirin EC, acyclovir, vardenafil, and atorvastatin.   Follow-up: Return if symptoms worsen or fail to improve.  Jeanine Luzalone, Carlis Blanchard, FNP

## 2017-04-28 NOTE — Patient Instructions (Signed)
Thank you for choosing [facility].  SUMMARY AND INSTRUCTIONS:  Clean with soap and water.   Monitor for signs of infection.   Follow up:  If your symptoms worsen or fail to improve, please contact our office for further instruction, or in case of emergency go directly to the emergency room at the closest medical facility.   Suture Removal, Care After Refer to this sheet in the next few weeks. These instructions provide you with information on caring for yourself after your procedure. Your health care provider may also give you more specific instructions. Your treatment has been planned according to current medical practices, but problems sometimes occur. Call your health care provider if you have any problems or questions after your procedure. What can I expect after the procedure? After your stitches (sutures) are removed, it is typical to have the following:  Some discomfort and swelling in the wound area.  Slight redness in the area.  Follow these instructions at home:  If you have skin adhesive strips over the wound area, do not take the strips off. They will fall off on their own in a few days. If the strips remain in place after 14 days, you may remove them.  Change any bandages (dressings) at least once a day or as directed by your health care provider. If the bandage sticks, soak it off with warm, soapy water.  Apply cream or ointment only as directed by your health care provider. If using cream or ointment, wash the area with soap and water 2 times a day to remove all the cream or ointment. Rinse off the soap and pat the area dry with a clean towel.  Keep the wound area dry and clean. If the bandage becomes wet or dirty, or if it develops a bad smell, change it as soon as possible.  Continue to protect the wound from injury.  Use sunscreen when out in the sun. New scars become sunburned easily. Contact a health care provider if:  You have increasing redness,  swelling, or pain in the wound.  You see pus coming from the wound.  You have a fever.  You notice a bad smell coming from the wound or dressing.  Your wound breaks open (edges not staying together). This information is not intended to replace advice given to you by your health care provider. Make sure you discuss any questions you have with your health care provider. Document Released: 06/17/2001 Document Revised: 02/28/2016 Document Reviewed: 05/04/2013 Elsevier Interactive Patient Education  2017 [employer]

## 2017-04-29 DIAGNOSIS — S86001D Unspecified injury of right Achilles tendon, subsequent encounter: Secondary | ICD-10-CM | POA: Diagnosis not present

## 2018-04-23 DIAGNOSIS — H52203 Unspecified astigmatism, bilateral: Secondary | ICD-10-CM | POA: Diagnosis not present

## 2018-04-23 DIAGNOSIS — H2513 Age-related nuclear cataract, bilateral: Secondary | ICD-10-CM | POA: Diagnosis not present

## 2018-04-23 DIAGNOSIS — H5203 Hypermetropia, bilateral: Secondary | ICD-10-CM | POA: Diagnosis not present

## 2018-04-23 DIAGNOSIS — H524 Presbyopia: Secondary | ICD-10-CM | POA: Diagnosis not present

## 2018-06-01 ENCOUNTER — Ambulatory Visit (INDEPENDENT_AMBULATORY_CARE_PROVIDER_SITE_OTHER): Payer: Medicare Other | Admitting: Family

## 2018-06-01 ENCOUNTER — Other Ambulatory Visit (INDEPENDENT_AMBULATORY_CARE_PROVIDER_SITE_OTHER): Payer: Medicare Other

## 2018-06-01 ENCOUNTER — Encounter: Payer: Self-pay | Admitting: Family

## 2018-06-01 VITALS — BP 118/78 | HR 66 | Temp 97.5°F | Ht 70.0 in | Wt 182.1 lb

## 2018-06-01 DIAGNOSIS — Z125 Encounter for screening for malignant neoplasm of prostate: Secondary | ICD-10-CM | POA: Diagnosis not present

## 2018-06-01 DIAGNOSIS — Z0000 Encounter for general adult medical examination without abnormal findings: Secondary | ICD-10-CM

## 2018-06-01 DIAGNOSIS — E782 Mixed hyperlipidemia: Secondary | ICD-10-CM

## 2018-06-01 LAB — CBC WITH DIFFERENTIAL/PLATELET
BASOS ABS: 0 10*3/uL (ref 0.0–0.1)
Basophils Relative: 0.9 % (ref 0.0–3.0)
EOS PCT: 2.1 % (ref 0.0–5.0)
Eosinophils Absolute: 0.1 10*3/uL (ref 0.0–0.7)
HEMATOCRIT: 41.8 % (ref 39.0–52.0)
Hemoglobin: 14.3 g/dL (ref 13.0–17.0)
LYMPHS ABS: 1.5 10*3/uL (ref 0.7–4.0)
LYMPHS PCT: 30.7 % (ref 12.0–46.0)
MCHC: 34.2 g/dL (ref 30.0–36.0)
MCV: 99.1 fl (ref 78.0–100.0)
MONOS PCT: 10.5 % (ref 3.0–12.0)
Monocytes Absolute: 0.5 10*3/uL (ref 0.1–1.0)
NEUTROS ABS: 2.7 10*3/uL (ref 1.4–7.7)
Neutrophils Relative %: 55.8 % (ref 43.0–77.0)
PLATELETS: 253 10*3/uL (ref 150.0–400.0)
RBC: 4.21 Mil/uL — ABNORMAL LOW (ref 4.22–5.81)
RDW: 12.3 % (ref 11.5–15.5)
WBC: 4.9 10*3/uL (ref 4.0–10.5)

## 2018-06-01 LAB — PSA: PSA: 2.34 ng/mL (ref 0.10–4.00)

## 2018-06-01 LAB — COMPREHENSIVE METABOLIC PANEL
ALT: 22 U/L (ref 0–53)
AST: 19 U/L (ref 0–37)
Albumin: 4.1 g/dL (ref 3.5–5.2)
Alkaline Phosphatase: 63 U/L (ref 39–117)
BILIRUBIN TOTAL: 0.8 mg/dL (ref 0.2–1.2)
BUN: 24 mg/dL — ABNORMAL HIGH (ref 6–23)
CHLORIDE: 104 meq/L (ref 96–112)
CO2: 27 meq/L (ref 19–32)
Calcium: 9.5 mg/dL (ref 8.4–10.5)
Creatinine, Ser: 1.04 mg/dL (ref 0.40–1.50)
GFR: 74.89 mL/min (ref >60.00)
Glucose, Bld: 101 mg/dL — ABNORMAL HIGH (ref 70–99)
POTASSIUM: 4.4 meq/L (ref 3.5–5.1)
Sodium: 137 mEq/L (ref 135–145)
Total Protein: 6.6 g/dL (ref 6.0–8.3)

## 2018-06-01 LAB — LIPID PANEL
CHOL/HDL RATIO: 3
Cholesterol: 196 mg/dL (ref 0–200)
HDL: 63.2 mg/dL (ref >39.00)
LDL CALC: 115 mg/dL — AB (ref 0–99)
NonHDL: 132.83
Triglycerides: 89 mg/dL (ref 0.0–149.0)
VLDL: 17.8 mg/dL (ref 0.0–40.0)

## 2018-06-01 MED ORDER — ZOSTER VAC RECOMB ADJUVANTED 50 MCG/0.5ML IM SUSR: 0.5000 mL | Freq: Once | INTRAMUSCULAR | 1 refills | Status: AC

## 2018-06-01 MED ORDER — ZOSTER VAC RECOMB ADJUVANTED 50 MCG/0.5ML IM SUSR: 0.5000 mL | Freq: Once | INTRAMUSCULAR | 1 refills | Status: DC

## 2018-06-01 NOTE — Progress Notes (Signed)
Here for medicare wellness, no new complaints. Please see A/P for status and treatment of chronic medical problems.   Diet: heart healthy or DM if diabetic Physical activity: sedentary Depression/mood screen: negative Hearing: intact to whispered voice Visual acuity: grossly normal, performs annual eye exam  ADLs: capable Fall risk: none Home safety: good Cognitive evaluation: intact to orientation, naming, recall and repetition EOL planning: adv directives discussed  I have personally reviewed and have noted 1. The patient's medical and social history - reviewed today no changes 2. Their use of alcohol, tobacco or illicit drugs 3. Their current medications and supplements 4. The patient's functional ability including ADL's, fall risks, home safety risks and hearing or visual impairment. 5. Diet and physical activities 6. Evidence for depression or mood disorders 7. Care team reviewed and updated (available in snapshot)   Gordon Baker is a 72 y.o. male with the following history as recorded in EpicCare:  Patient Active Problem List   Diagnosis Date Noted  . Laceration of fingers without complication 80/99/8338  . Atrial fibrillation ([place]) 05/19/2016  . Medicare annual wellness visit, subsequent 03/19/2016  . Tinnitus 03/19/2016  . Irregular heartbeat 03/19/2016  . Encounter for general adult medical examination with abnormal findings 08/14/2014  . KNEE PAIN, LEFT 12/09/2010  . 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 Hyperlipidemia 07/17/2010  . 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 acyclovir (ZOVIRAX) 400 MG tablet Take 400 mg by mouth 3 (three) times daily as needed (OUTBREAKS).     Marland Kitchen aspirin EC 81 MG tablet Take 81 mg by mouth every other day.     Marland Kitchen atorvastatin (LIPITOR) 10 MG tablet Take 1 tablet (10 mg total) by mouth daily. (Patient taking differently: Take 10 mg by mouth every other  day. ) 90 tablet 0  . Glucos-Chondroit-Hyaluron-MSM (GLUCOSAMINE CHONDROITIN JOINT PO) Take 1 tablet by mouth daily.     [name] 500 MG CAPS Take 2 capsules by mouth daily.     . multivitamin (THERAGRAN) per tablet Take 1 tablet by mouth daily.      Marland Kitchen Zoster Vaccine Adjuvanted [facility]) injection Inject 0.5 mLs into the muscle once for 1 dose. 0.5 mL 1   No current facility-administered medications for this visit.     Allergies: Patient has no known allergies.  Past Medical History:  Diagnosis Date  . Achilles tendon rupture   . Allergy   . Hyperlipidemia     Past Surgical History:  Procedure Laterality Date  . Achilles tendone repair    . KNEE ARTHROSCOPY     left knee December 2013  . TONSILLECTOMY      Family History  Problem Relation Age of Onset  . Alzheimer's disease Mother        65  . Breast cancer Mother   . Alcohol abuse Father   . Heart disease Father   . Breast cancer Sister   . Colon cancer Neg Hx   . Esophageal cancer Neg Hx   . Stomach cancer Neg Hx   . Rectal cancer Neg Hx   . Heart attack Neg Hx     Social History   Tobacco Use  . Smoking status: Former Smoker    Packs/day: 0.50    Years: 3.00    Pack years: 1.50  . Smokeless tobacco: Never Used  Substance Use Topics  . Alcohol use: Yes    Alcohol/week: 7.0 standard drinks  Types: 7 Cans of beer per week    Comment: beer almost every night    Subjective:  Patient presents for yearly AWV; in baseline state of health with no concerns; up to date on eye doctor and dentist exams; will be scheduling to see his cardiologist for yearly follow-up on episode of paroxysmal Afib; Up to date on colonoscopy- repeat in 2022; Interested in [place]; vaccines up to date otherwise;   Objective:  Vitals:   06/01/18 0820  BP: 118/78  Pulse: 66  Temp: (!) 97.5 F (36.4 C)  TempSrc: Oral  SpO2: 94%  Weight: 182 lb 1.3 oz (82.6 kg)  Height: _0  (1.778 m)    General: Well developed, well  nourished, in no acute distress  Skin : Warm and dry.  Head: Normocephalic and atraumatic  Eyes: Sclera and conjunctiva clear; pupils round and reactive to light; extraocular movements intact  Ears: External normal; canals clear; tympanic membranes normal  Oropharynx: Pink, supple. No suspicious lesions  Neck: Supple without thyromegaly, adenopathy  Lungs: Respirations unlabored; clear to auscultation bilaterally without wheeze, rales, rhonchi  CVS exam: normal rate and regular rhythm.  Abdomen: Soft; nontender; nondistended; normoactive bowel sounds; no masses or hepatosplenomegaly  Musculoskeletal: No deformities; no active joint inflammation  Extremities: No edema, cyanosis, clubbing  Vessels: Symmetric bilaterally  Neurologic: Alert and oriented; speech intact; face symmetrical; moves all extremities well; CNII-XII intact without focal deficit   Assessment:  1. Medicare annual wellness visit, subsequent   2. Mixed hyperlipidemia   3. Prostate cancer screening     Plan:  Age appropriate preventive healthcare needs addressed; encouraged regular eye doctor and dental exams; encouraged regular exercise; will update labs and refills as needed today; follow-up to be determined; Encouraged to schedule follow-up with his cardiologist- he agrees;   Rx for Shingrix given- he will get at local pharmacy;   No follow-ups on file.  Orders Placed This Encounter  Procedures  . CBC w/Diff    Standing Status:   Future    Number of Occurrences:   1    Standing Expiration Date:   06/01/2019  . Comp Met (CMET)    Standing Status:   Future    Number of Occurrences:   1    Standing Expiration Date:   06/01/2019  . Lipid panel    Standing Status:   Future    Number of Occurrences:   1    Standing Expiration Date:   06/02/2019  . PSA    Standing Status:   Future    Number of Occurrences:   1    Standing Expiration Date:   06/01/2019    Requested Prescriptions   Signed Prescriptions Disp Refills   . Zoster Vaccine Adjuvanted [facility]) injection 0.5 mL 1    Sig: Inject 0.5 mLs into the muscle once for 1 dose.

## 2018-06-01 NOTE — Patient Instructions (Signed)
Lance MussJayadeep Varanasi, MD  09/23/2016 10:04 AM    [facility] [address], [place], [place]  [address] Phone: [phone]; Fax: [phone]

## 2018-06-24 ENCOUNTER — Ambulatory Visit: Payer: Self-pay

## 2018-07-27 ENCOUNTER — Other Ambulatory Visit: Payer: Self-pay | Admitting: Family

## 2018-07-27 NOTE — Telephone Encounter (Signed)
Copied from CRM [id number]. Topic: Quick Communication - Rx Refill/Question >> Jul 27, 2018  3:16 PM Gerrianne Scale wrote: Medication: acyclovir (ZOVIRAX) 400 MG tablet atorvastatin (LIPITOR) 10 MG tablet  Has the patient contacted their pharmacy? No. Because bottle had no refill (Agent: If no, request that the patient contact the pharmacy for the refill.) (Agent: If yes, when and what did the pharmacy advise?)    Preferred Pharmacy (with phone number or street name): [facility] [address] [place] - [address] She[place] Maroon [phone] (Phone) [phone] (Fax)    Agent: Please be advised that RX refills may take up to 3 business days. We ask that you follow-up with your pharmacy.

## 2018-07-27 NOTE — Telephone Encounter (Signed)
Requested medication (s) are due for refill today:  yes  Requested medication (s) are on the active medication list:  yes  Future visit scheduled:  no  Last Refill: Acyclovir; 03/19/16; Historical provider Last Refill: Atorvastatin; 03/31/17; # 90; no refills  Please note that medication record indicated pt. is taking Atovastatin 10 mg. qod (taking differently from the prescription)   Meets refill criteria, but Rx's have expired.  Requested Prescriptions  Pending Prescriptions Disp Refills   acyclovir (ZOVIRAX) 400 MG tablet      Sig: Take 1 tablet (400 mg total) by mouth 3 (three) times daily as needed (OUTBREAKS).     Antimicrobials:  Antiviral Agents - Anti-Herpetic Passed - 07/27/2018  3:41 PM      Passed - Valid encounter within last 12 months    Recent Outpatient Visits          1 month ago Medicare annual wellness visit, subsequent   [facility] -Shanna Cisco, Allyne Gee, FNP   1 year ago Laceration of finger without complication, subsequent encounter   [facility] -Olive Bass, FNP   1 year ago Laceration of finger without complication, subsequent encounter   [facility] -Olive Bass, FNP   1 year ago Medicare annual wellness visit, subsequent   [facility] -Olive Bass, FNP   2 years ago Medicare annual wellness visit, subsequent   [facility] -Darnelle Catalan, Tama Headings, FNP            atorvastatin (LIPITOR) 10 MG tablet 90 tablet 0    Sig: Take 1 tablet (10 mg total) by mouth daily.     Cardiovascular:  Antilipid - Statins Failed - 07/27/2018  3:41 PM      Failed - LDL in normal range and within 360 days    LDL Cholesterol  Date Value Ref Range Status  06/01/2018 115 (H) 0 - 99 mg/dL Final         Passed - Total Cholesterol in normal range and within 360 days    Cholesterol  Date Value Ref Range Status  06/01/2018 196 0 - 200  mg/dL Final    Comment:    ATP III Classification       Desirable:  < 200 mg/dL               Borderline High:  200 - 239 mg/dL          High:  > = 161 mg/dL         Passed - HDL in normal range and within 360 days    HDL  Date Value Ref Range Status  06/01/2018 63.20 >39.00 mg/dL Final         Passed - Triglycerides in normal range and within 360 days    Triglycerides  Date Value Ref Range Status  06/01/2018 89.0 0.0 - 149.0 mg/dL Final    Comment:    Normal:  <150 mg/dLBorderline High:  150 - 199 mg/dL         Passed - Patient is not pregnant      Passed - Valid encounter within last 12 months    Recent Outpatient Visits          1 month ago Medicare annual wellness visit, subsequent   [facility] -Shanna Cisco, Allyne Gee, FNP   1 year ago Laceration of finger without complication, subsequent encounter   [facility] -Olive Bass, FNP   1 year ago Laceration of finger without complication, subsequent encounter   [facility] -Olive Bass, FNP   1 year ago Medicare annual wellness visit, subsequent   [facility] -Olive Bass, FNP   2 years ago Medicare annual wellness visit, subsequent   [facility] -Darnelle Catalan, Tama Headings, FNP

## 2018-07-28 MED ORDER — ATORVASTATIN CALCIUM 10 MG PO TABS: 10.0000 mg | ORAL_TABLET | Freq: Every day | ORAL | 3 refills | Status: DC

## 2018-07-28 MED ORDER — ACYCLOVIR 400 MG PO TABS: 400.0000 mg | ORAL_TABLET | Freq: Three times a day (TID) | ORAL | 2 refills | Status: AC | PRN

## 2019-03-04 ENCOUNTER — Encounter: Payer: Self-pay | Admitting: Family

## 2019-07-21 ENCOUNTER — Other Ambulatory Visit: Payer: Self-pay

## 2019-07-21 ENCOUNTER — Ambulatory Visit (INDEPENDENT_AMBULATORY_CARE_PROVIDER_SITE_OTHER): Payer: Medicare Other

## 2019-07-21 DIAGNOSIS — Z23 Encounter for immunization: Secondary | ICD-10-CM | POA: Diagnosis not present

## 2019-08-08 ENCOUNTER — Other Ambulatory Visit: Payer: Self-pay | Admitting: Family

## 2019-08-08 MED ORDER — ATORVASTATIN CALCIUM 10 MG PO TABS: 10.0000 mg | ORAL_TABLET | Freq: Every day | ORAL | 0 refills | Status: DC

## 2019-08-16 DIAGNOSIS — H2513 Age-related nuclear cataract, bilateral: Secondary | ICD-10-CM | POA: Diagnosis not present

## 2019-08-16 DIAGNOSIS — H5203 Hypermetropia, bilateral: Secondary | ICD-10-CM | POA: Diagnosis not present

## 2019-08-16 DIAGNOSIS — H524 Presbyopia: Secondary | ICD-10-CM | POA: Diagnosis not present

## 2019-08-16 DIAGNOSIS — H52203 Unspecified astigmatism, bilateral: Secondary | ICD-10-CM | POA: Diagnosis not present

## 2019-08-30 ENCOUNTER — Other Ambulatory Visit (INDEPENDENT_AMBULATORY_CARE_PROVIDER_SITE_OTHER): Payer: Medicare Other

## 2019-08-30 ENCOUNTER — Encounter: Payer: Self-pay | Admitting: Family

## 2019-08-30 ENCOUNTER — Ambulatory Visit (INDEPENDENT_AMBULATORY_CARE_PROVIDER_SITE_OTHER): Payer: Medicare Other | Admitting: Family

## 2019-08-30 ENCOUNTER — Other Ambulatory Visit: Payer: Self-pay

## 2019-08-30 VITALS — BP 112/76 | HR 58 | Temp 97.9°F | Ht 70.0 in | Wt 177.6 lb

## 2019-08-30 DIAGNOSIS — I480 Paroxysmal atrial fibrillation: Secondary | ICD-10-CM

## 2019-08-30 DIAGNOSIS — I4891 Unspecified atrial fibrillation: Secondary | ICD-10-CM | POA: Diagnosis not present

## 2019-08-30 DIAGNOSIS — E782 Mixed hyperlipidemia: Secondary | ICD-10-CM

## 2019-08-30 DIAGNOSIS — Z125 Encounter for screening for malignant neoplasm of prostate: Secondary | ICD-10-CM

## 2019-08-30 DIAGNOSIS — Z0000 Encounter for general adult medical examination without abnormal findings: Secondary | ICD-10-CM | POA: Diagnosis not present

## 2019-08-30 LAB — COMPREHENSIVE METABOLIC PANEL
ALT: 24 U/L (ref 0–53)
AST: 23 U/L (ref 0–37)
Albumin: 4 g/dL (ref 3.5–5.2)
Alkaline Phosphatase: 71 U/L (ref 39–117)
BUN: 17 mg/dL (ref 6–23)
CO2: 28 mEq/L (ref 19–32)
Calcium: 9.3 mg/dL (ref 8.4–10.5)
Chloride: 103 mEq/L (ref 96–112)
Creatinine, Ser: 0.88 mg/dL (ref 0.40–1.50)
GFR: 85.14 mL/min (ref >60.00)
Glucose, Bld: 101 mg/dL — ABNORMAL HIGH (ref 70–99)
Potassium: 4.4 mEq/L (ref 3.5–5.1)
Sodium: 138 mEq/L (ref 135–145)
Total Bilirubin: 0.8 mg/dL (ref 0.2–1.2)
Total Protein: 6.8 g/dL (ref 6.0–8.3)

## 2019-08-30 LAB — CBC WITH DIFFERENTIAL/PLATELET
Basophils Absolute: 0 10*3/uL (ref 0.0–0.1)
Basophils Relative: 0.7 % (ref 0.0–3.0)
Eosinophils Absolute: 0.1 10*3/uL (ref 0.0–0.7)
Eosinophils Relative: 1.4 % (ref 0.0–5.0)
HCT: 41.6 % (ref 39.0–52.0)
Hemoglobin: 14.3 g/dL (ref 13.0–17.0)
Lymphocytes Relative: 29.6 % (ref 12.0–46.0)
Lymphs Abs: 1.6 10*3/uL (ref 0.7–4.0)
MCHC: 34.4 g/dL (ref 30.0–36.0)
MCV: 98.9 fl (ref 78.0–100.0)
Monocytes Absolute: 0.6 10*3/uL (ref 0.1–1.0)
Monocytes Relative: 11.7 % (ref 3.0–12.0)
Neutro Abs: 3 10*3/uL (ref 1.4–7.7)
Neutrophils Relative %: 56.6 % (ref 43.0–77.0)
Platelets: 229 10*3/uL (ref 150.0–400.0)
RBC: 4.21 Mil/uL — ABNORMAL LOW (ref 4.22–5.81)
RDW: 12.2 % (ref 11.5–15.5)
WBC: 5.3 10*3/uL (ref 4.0–10.5)

## 2019-08-30 LAB — LIPID PANEL
Cholesterol: 236 mg/dL — ABNORMAL HIGH (ref 0–200)
HDL: 68.9 mg/dL (ref >39.00)
LDL Cholesterol: 152 mg/dL — ABNORMAL HIGH (ref 0–99)
NonHDL: 166.79
Total CHOL/HDL Ratio: 3
Triglycerides: 74 mg/dL (ref 0.0–149.0)
VLDL: 14.8 mg/dL (ref 0.0–40.0)

## 2019-08-30 LAB — VITAMIN D 25 HYDROXY (VIT D DEFICIENCY, FRACTURES): VITD: 36.37 ng/mL (ref 30.00–100.00)

## 2019-08-30 LAB — PSA, MEDICARE: PSA: 2.04 ng/ml (ref 0.10–4.00)

## 2019-08-30 MED ORDER — ATORVASTATIN CALCIUM 10 MG PO TABS: 10.0000 mg | ORAL_TABLET | Freq: Every day | ORAL | 3 refills | Status: DC

## 2019-08-30 NOTE — Progress Notes (Signed)
Gordon Baker is a 73 y.o. male with the following history as recorded in EpicCare:  Patient Active Problem List   Diagnosis Date Noted  . Laceration of fingers without complication 22/11/5425  . Atrial fibrillation ([place]) 05/19/2016  . Medicare annual wellness visit, subsequent 03/19/2016  . Tinnitus 03/19/2016  . Irregular heartbeat 03/19/2016  . Encounter for general adult medical examination with abnormal findings 08/14/2014  . KNEE PAIN, LEFT 12/09/2010  . 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 Hyperlipidemia 07/17/2010  . 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 acyclovir (ZOVIRAX) 400 MG tablet Take 1 tablet (400 mg total) by mouth 3 (three) times daily as needed (OUTBREAKS). 30 tablet 2  . aspirin EC 81 MG tablet Take 81 mg by mouth every other day.     Marland Kitchen atorvastatin (LIPITOR) 10 MG tablet Take 1 tablet (10 mg total) by mouth daily. 90 tablet 3  . Glucos-Chondroit-Hyaluron-MSM (GLUCOSAMINE CHONDROITIN JOINT PO) Take 1 tablet by mouth daily.     [name] 500 MG CAPS Take 2 capsules by mouth daily.     . multivitamin (THERAGRAN) per tablet Take 1 tablet by mouth daily.       No current facility-administered medications for this visit.     Allergies: Patient has no known allergies.  Past Medical History:  Diagnosis Date  . Achilles tendon rupture   . Allergy   . Hyperlipidemia     Past Surgical History:  Procedure Laterality Date  . Achilles tendone repair    . KNEE ARTHROSCOPY     left knee December 2013  . TONSILLECTOMY      Family History  Problem Relation Age of Onset  . Alzheimer's disease Mother        54  . Breast cancer Mother   . Alcohol abuse Father   . Heart disease Father   . Breast cancer Sister   . Colon cancer Neg Hx   . Esophageal cancer Neg Hx   . Stomach cancer Neg Hx   . Rectal cancer Neg Hx   . Heart attack Neg Hx     Social History   Tobacco Use  .  Smoking status: Former Smoker    Packs/day: 0.50    Years: 3.00    Pack years: 1.50  . Smokeless tobacco: Never Used  Substance Use Topics  . Alcohol use: Yes    Alcohol/week: 7.0 standard drinks    Types: 7 Cans of beer per week    Comment: beer almost every night    Subjective:   Here for medicare wellness, no new complaints. Please see A/P for status and treatment of chronic medical problems.   Diet: heart healthy or DM if diabetic Physical activity: sedentary Depression/mood screen: negative Hearing: intact to whispered voice Visual acuity: grossly normal, performs annual eye exam  ADLs: capable Fall risk: none Home safety: good Cognitive evaluation: intact to orientation, naming, recall and repetition EOL planning: adv directives discussed    Office Visit from 08/30/2019 in [place]  PHQ-2 Total Score  0        I have personally reviewed and have noted 1. The patient's medical and social history - reviewed today no changes 2. Their use of alcohol, tobacco or illicit drugs 3. Their current medications and supplements 4. The patient's functional ability including ADL's, fall risks, home safety risks and hearing or visual impairment. 5. Diet and physical activities  6. Evidence for depression or mood disorders 7. Care team reviewed and updated  Patient Care Team: Marrian Salvage, FNP as PCP - General (Internal Medicine) Past Medical History:  Diagnosis Date  . Achilles tendon rupture   . Allergy   . Hyperlipidemia    Past Surgical History:  Procedure Laterality Date  . Achilles tendone repair    . KNEE ARTHROSCOPY     left knee December 2013  . TONSILLECTOMY     Family History  Problem Relation Age of Onset  . Alzheimer's disease Mother        88  . Breast cancer Mother   . Alcohol abuse Father   . Heart disease Father   . Breast cancer Sister   . Colon cancer Neg Hx   . Esophageal cancer Neg Hx   . Stomach cancer  Neg Hx   . Rectal cancer Neg Hx   . Heart attack Neg Hx     Health Maintenance  Topic Date Due  . COLONOSCOPY  04/11/2024  . TETANUS/TDAP  03/19/2026  . INFLUENZA VACCINE  Completed  . Hepatitis C Screening  Completed  . PNA vac Low Risk Adult  Completed    Review of Systems  Constitutional: Negative.   HENT: Negative.   Eyes: Negative.   Respiratory: Negative.   Cardiovascular: Negative.   Gastrointestinal: Negative.   Genitourinary: Negative.   Musculoskeletal: Negative.   Skin: Negative.   Neurological: Negative.   Endo/Heme/Allergies: Negative.   Psychiatric/Behavioral: Negative.      Objective:  Vitals:   08/30/19 0922  BP: 112/76  Pulse: (!) 58  Temp: 97.9 F (36.6 C)  TempSrc: Oral  SpO2: 97%  Weight: 177 lb 9.6 oz (80.6 kg)  Height: _0  (1.778 m)    General: Well developed, well nourished, in no acute distress  Skin : Warm and dry.  Head: Normocephalic and atraumatic  Eyes: Sclera and conjunctiva clear; pupils round and reactive to light; extraocular movements intact  Ears: External normal; canals clear; tympanic membranes normal  Oropharynx: Pink, supple. No suspicious lesions  Neck: Supple without thyromegaly, adenopathy  Lungs: Respirations unlabored; clear to auscultation bilaterally without wheeze, rales, rhonchi  CVS exam: normal rate and regular rhythm.  Abdomen: Soft; nontender; nondistended; normoactive bowel sounds; no masses or hepatosplenomegaly  Musculoskeletal: No deformities; no active joint inflammation  Extremities: No edema, cyanosis, clubbing  Vessels: Symmetric bilaterally  Neurologic: Alert and oriented; speech intact; face symmetrical; moves all extremities well; CNII-XII intact without focal deficit   Assessment:  1. PE (physical exam), annual   2. Mixed hyperlipidemia   3. Prostate cancer screening   4. Paroxysmal atrial fibrillation ([facility]) Chronic    Plan:  Age appropriate preventive healthcare needs addressed;  encouraged regular eye doctor and dental exams; encouraged regular exercise and weight loss; will update labs and refills as needed today; follow-up to be determined; Patient is planning to get his Shingrix at his pharmacy; EKG updated today with NSR- no A. Fib noted; follow-up in 1 year, sooner prn.   This visit occurred during the SARS-CoV-2 public health emergency.  Safety protocols were in place, including screening questions prior to the visit, additional usage of staff PPE, and extensive cleaning of exam room while observing appropriate contact time as indicated for disinfecting solutions.    No follow-ups on file.  Orders Placed This Encounter  Procedures  . CBC w/Diff    Standing Status:   Future    Standing Expiration Date:   08/29/2020  .  Comp Met (CMET)    Standing Status:   Future    Standing Expiration Date:   08/29/2020  . Lipid panel    Standing Status:   Future    Standing Expiration Date:   08/29/2020  . Vitamin D (25 hydroxy)    Standing Status:   Future    Standing Expiration Date:   08/29/2020  . PSA, Medicare    Standing Status:   Future    Standing Expiration Date:   08/29/2020    Requested Prescriptions   Signed Prescriptions Disp Refills  . atorvastatin (LIPITOR) 10 MG tablet 90 tablet 3    Sig: Take 1 tablet (10 mg total) by mouth daily.

## 2019-09-22 ENCOUNTER — Ambulatory Visit: Payer: Medicare Other | Attending: Internal Medicine

## 2019-09-22 DIAGNOSIS — Z20822 Contact with and (suspected) exposure to covid-19: Secondary | ICD-10-CM

## 2019-09-23 LAB — NOVEL CORONAVIRUS, NAA: SARS-CoV-2, NAA: NOT DETECTED

## 2019-10-25 ENCOUNTER — Encounter: Payer: Self-pay | Admitting: Family

## 2019-11-01 ENCOUNTER — Encounter: Payer: Self-pay | Admitting: Interventional Cardiology

## 2019-11-01 ENCOUNTER — Other Ambulatory Visit: Payer: Self-pay

## 2019-11-01 ENCOUNTER — Ambulatory Visit: Payer: Medicare Other | Admitting: Interventional Cardiology

## 2019-11-01 VITALS — BP 116/54 | HR 61 | Ht 71.0 in | Wt 180.0 lb

## 2019-11-01 DIAGNOSIS — E785 Hyperlipidemia, unspecified: Secondary | ICD-10-CM | POA: Diagnosis not present

## 2019-11-01 DIAGNOSIS — I480 Paroxysmal atrial fibrillation: Secondary | ICD-10-CM

## 2019-11-01 NOTE — Patient Instructions (Addendum)
Medication Instructions:  Your physician recommends that you continue on your current medications as directed. Please refer to the Current Medication list given to you today.  *If you need a refill on your cardiac medications before your next appointment, please call your pharmacy*  Lab Work: Your physician recommends that you return for a FASTING lipid profile and liver function panel in [facility]  If you have labs (blood work) drawn today and your tests are completely normal, you will receive your results only by: Marland Kitchen MyChart Message (if you have MyChart) OR . A paper copy in the mail If you have any lab test that is abnormal or we need to change your treatment, we will call you to review the results.  Testing/Procedures: Your physician has requested that you have an abdominal aorta duplex. During this test, an ultrasound is used to evaluate the aorta. Allow 30 minutes for this exam. Do not eat after midnight the day before and avoid carbonated beverages  Follow-Up: At [facility], you and your health needs are our priority.  As part of our continuing mission to provide you with exceptional heart care, we have created designated Provider Care Teams.  These Care Teams include your primary Cardiologist (physician) and Advanced Practice Providers (APPs -  Physician Assistants and Nurse Practitioners) who all work together to provide you with the care you need, when you need it.  Your next appointment:   12 month(s)  The format for your next appointment:   In Person  Provider:   You may see Everette Rank, MD or one of the following Advanced Practice Providers on your designated Care Team:    Ronie Spies, PA-C  Jacolyn Reedy, PA-C   Other Instructions

## 2019-11-01 NOTE — Progress Notes (Signed)
Cardiology Office Note   Date:  11/01/2019   ID:  Gordon, Baker 10-30-1945, MRN [id number]  PCP:  Marrian Salvage, FNP    No chief complaint on file.  AFib  Wt Readings from Last 3 Encounters:  11/01/19 180 lb (81.6 kg)  08/30/19 177 lb 9.6 oz (80.6 kg)  06/01/18 182 lb 1.3 oz (82.6 kg)       History of Present Illness: Gordon Baker is a 74 y.o. male  Who I last saw in 2017.  At the time, it was noted that: "who does not have much of a medical history. He had a routine ECG and was found to be in atrial fibrillation back in June. He was asymptomatic at the time. He was maintained on aspirin. He denies any palpitations. He occasionally had chest pain back in the summer. This was worse with emotional stress when he is sitting at his desk at work. No relation to exertion. He plays racquetball at a high level regularly and has no problems.  No bleeding problems. No hypertension, diabetes, prior structural heart disease, prior stroke he does not smoke. No cardiac history in his family. No prior thromboembolism."  Plan in 2017 was: "Appears to be in sinus today. Patient with paroxysmal atrial fibrillation months ago. There is an ECG showing atrial fibrillation on June 14. He has been maintained on aspirin since that time. His lab work was essentially normal except for elevated cholesterol. TSH was normal.  Normal echo. Continue aspirin. His chads score is only 1 so he does not require anticoagulation."  Crestor for lipids.   Since the last visit, he has done well.  ECG was last done in 08/2019 and he was in NSR.  Denies : Chest pain. Dizziness. Leg edema. Nitroglycerin use. Orthopnea. Palpitations. Paroxysmal nocturnal dyspnea. Shortness of breath. Syncope.   Plays pickle ball for exercise.  No problems with that activity.    Past Medical History:  Diagnosis Date  . Achilles tendon rupture   . Allergy   . Hyperlipidemia     Past Surgical History:  Procedure  Laterality Date  . Achilles tendone repair    . KNEE ARTHROSCOPY     left knee December 2013  . TONSILLECTOMY       Current Outpatient Medications  Medication Sig Dispense Refill  . acyclovir (ZOVIRAX) 400 MG tablet Take 1 tablet (400 mg total) by mouth 3 (three) times daily as needed (OUTBREAKS). 30 tablet 2  . aspirin EC 81 MG tablet Take 81 mg by mouth every other day.     Marland Kitchen atorvastatin (LIPITOR) 10 MG tablet Take 10 mg by mouth every other day.    . Glucos-Chondroit-Hyaluron-MSM (GLUCOSAMINE CHONDROITIN JOINT PO) Take 1 tablet by mouth daily.     [name] 500 MG CAPS Take 2 capsules by mouth daily.     . multivitamin (THERAGRAN) per tablet Take 1 tablet by mouth daily.       No current facility-administered medications for this visit.    Allergies:   Patient has no known allergies.    Social History:  The patient  reports that he has quit smoking. He has a 1.50 pack-year smoking history. He has never used smokeless tobacco. He reports current alcohol use of about 7.0 standard drinks of alcohol per week. He reports that he does not use drugs.   Family History:  The patient's family history includes Alcohol abuse in his father; Alzheimer's disease in his mother; Breast  cancer in his mother and sister; Heart disease in his father.    ROS:  Please see the history of present illness.   Otherwise, review of systems are positive for mild weight gain.   All other systems are reviewed and negative.    PHYSICAL EXAM: VS:  BP (!) 116/54   Pulse 61   Ht 5\' 11"  (1.803 m)   Wt 180 lb (81.6 kg)   SpO2 97%   BMI 25.10 kg/m  , BMI Body mass index is 25.1 kg/m. GEN: Well nourished, well developed, in no acute distress  HEENT: normal  Neck: no JVD, carotid bruits, or masses Cardiac: RRR; no murmurs, rubs, or gallops,no edema  Respiratory:  clear to auscultation bilaterally, normal work of breathing GI: soft, nontender, nondistended, + BS MS: no deformity or atrophy  Skin: warm and  dry, no rash Neuro:  Strength and sensation are intact Psych: euthymic mood, full affect   EKG:   The ekg ordered today demonstrates NSR, rSR', no ST changes   Recent Labs: 08/30/2019: ALT 24; BUN 17; Creatinine, Ser 0.88; Hemoglobin 14.3; Platelets 229.0; Potassium 4.4; Sodium 138   Lipid Panel    Component Value Date/Time   CHOL 236 (H) 08/30/2019 1019   TRIG 74.0 08/30/2019 1019   HDL 68.90 08/30/2019 1019   CHOLHDL 3 08/30/2019 1019   VLDL 14.8 08/30/2019 1019   LDLCALC 152 (H) 08/30/2019 1019   LDLDIRECT 116.8 03/26/2012 0907     Other studies Reviewed: Additional studies/ records that were reviewed today with results demonstrating: 2017 echo reviewed .   ASSESSMENT AND PLAN:  1. PAF: In NSR. Continue aspirin.  No bleeding issues with QOD aspirin.   Had some bleeding with daily aspirin 2. Hyperlipidemia: Increased atorvastatin to QOD in 08/2019 based on the LDL in 153.  He had muscle pain with Crestor.  Recheck lipids, liver tests in early March. Whole food plant based diet recommended. 3. Screening abdominal u/s given h/o smoking.  R/o AAA.   Current medicines are reviewed at length with the patient today.  The patient concerns regarding his medicines were addressed.  The following changes have been made:  No change  Labs/ tests ordered today include:  No orders of the defined types were placed in this encounter.   Recommend 150 minutes/week of aerobic exercise Low fat, low carb, high fiber diet recommended  Disposition:   FU in 1 year   Signed, April, MD  11/01/2019 8:26 AM    [facility] [address] [place], [place], [place]  [place] Phone: [phone]; Fax: [phone]

## 2019-11-02 ENCOUNTER — Other Ambulatory Visit: Payer: Self-pay | Admitting: Interventional Cardiology

## 2019-11-02 DIAGNOSIS — Z87891 Personal history of nicotine dependence: Secondary | ICD-10-CM

## 2019-11-10 ENCOUNTER — Other Ambulatory Visit: Payer: Self-pay

## 2019-11-10 ENCOUNTER — Ambulatory Visit (HOSPITAL_COMMUNITY)
Admission: RE | Admit: 2019-11-10 | Discharge: 2019-11-10 | Disposition: A | Discharge status: Home / Self Care | Payer: Medicare Other | Source: Ambulatory Visit | Attending: Cardiovascular Disease | Admitting: Cardiovascular Disease

## 2019-11-10 DIAGNOSIS — Z136 Encounter for screening for cardiovascular disorders: Secondary | ICD-10-CM | POA: Insufficient documentation

## 2019-11-10 DIAGNOSIS — E785 Hyperlipidemia, unspecified: Secondary | ICD-10-CM | POA: Insufficient documentation

## 2019-11-10 DIAGNOSIS — Z87891 Personal history of nicotine dependence: Secondary | ICD-10-CM | POA: Insufficient documentation

## 2019-11-14 ENCOUNTER — Encounter: Payer: Self-pay | Admitting: Family

## 2019-12-13 ENCOUNTER — Other Ambulatory Visit: Payer: Self-pay

## 2019-12-13 ENCOUNTER — Other Ambulatory Visit: Payer: Medicare Other

## 2019-12-13 DIAGNOSIS — E785 Hyperlipidemia, unspecified: Secondary | ICD-10-CM

## 2019-12-13 DIAGNOSIS — I480 Paroxysmal atrial fibrillation: Secondary | ICD-10-CM | POA: Diagnosis not present

## 2019-12-13 LAB — LIPID PANEL
Chol/HDL Ratio: 2.8 ratio (ref 0.0–5.0)
Cholesterol, Total: 215 mg/dL — ABNORMAL HIGH (ref 100–199)
HDL: 76 mg/dL (ref >39)
LDL Chol Calc (NIH): 126 mg/dL — ABNORMAL HIGH (ref 0–99)
Triglycerides: 74 mg/dL (ref 0–149)
VLDL Cholesterol Cal: 13 mg/dL (ref 5–40)

## 2019-12-13 LAB — HEPATIC FUNCTION PANEL
ALT: 25 IU/L (ref 0–44)
AST: 23 IU/L (ref 0–40)
Albumin: 4.1 g/dL (ref 3.7–4.7)
Alkaline Phosphatase: 79 IU/L (ref 39–117)
Bilirubin Total: 0.5 mg/dL (ref 0.0–1.2)
Bilirubin, Direct: 0.17 mg/dL (ref 0.00–0.40)
Total Protein: 6.4 g/dL (ref 6.0–8.5)

## 2020-01-12 ENCOUNTER — Other Ambulatory Visit: Payer: Medicare Other

## 2020-01-30 ENCOUNTER — Telehealth: Payer: Medicare Other | Admitting: Physician Assistant

## 2020-01-30 DIAGNOSIS — H00015 Hordeolum externum left lower eyelid: Secondary | ICD-10-CM

## 2020-01-30 MED ORDER — ERYTHROMYCIN 5 MG/GM OP OINT: 1.0000 "application " | TOPICAL_OINTMENT | Freq: Four times a day (QID) | OPHTHALMIC | 0 refills | Status: AC

## 2020-01-30 NOTE — Progress Notes (Signed)
We are sorry that you are not feeling well. Here is how we plan to help!  Based on what you have shared with me it looks like you have a stye.  A stye is an inflammation of the eyelid.  It is often a red, painful lump near the edge of the eyelid that may look like a boil or a pimple.  A stye develops when an infection occurs at the base of an eyelash.   We have made appropriate suggestions for you based upon your presentation: Simple styes can be treated without medical intervention.  Most styes either resolve spontaneously or resolve with simple home treatment by applying warm compresses or heated washcloth to the stye for about 10-15 minutes three to four times a day. This causes the stye to drain and resolve. I would recommend continuing this treatment. Because your symptoms have lasted for so long I will give you an antibiotic ointment so see if this helps with improving your symptoms. I have sent erythromycin ointment to your pharmacy. If your symptoms do not improve after using this medication for 5-7 days that you will need to see an eye doctor. If your symptoms worsen in any way then you will need to have an in person visit.   HOME CARE:   Wash your hands often!  Let the stye open on its own. Don't squeeze or open it.  Don't rub your eyes. This can irritate your eyes and let in bacteria.  If you need to touch your eyes, wash your hands first.  Don't wear eye makeup or contact lenses until the area has healed.  GET HELP RIGHT AWAY IF:   Your symptoms do not improve.  You develop blurred or loss of vision.  Your symptoms worsen (increased discharge, pain or redness).  Thank you for choosing an e-visit.  Your e-visit answers were reviewed by a board certified advanced clinical practitioner to complete your personal care plan.  Depending upon the condition, your plan could have included both over the counter or prescription medications.  Please review your pharmacy choice.  Make  sure the pharmacy is open so you can pick up prescription now.  If there is a problem, you may contact your provider through [employer] and have the prescription routed to another pharmacy.    Your safety is important to [place].  If you have drug allergies check your prescription carefully.  For the next 24 hours you can use MyChart to ask questions about today's visit, request a non-urgent call back, or ask for a work or school excuse.  You will get an email in the next two days asking about your experience.  I hope you that your e-visit has been valuable and will speed your recovery.   Approximately 5 minutes was spent documenting and reviewing patient's chart.

## 2020-04-30 ENCOUNTER — Encounter: Payer: Self-pay | Admitting: Family

## 2020-05-01 ENCOUNTER — Encounter: Payer: Self-pay | Admitting: Internal Medicine

## 2020-05-01 ENCOUNTER — Ambulatory Visit (INDEPENDENT_AMBULATORY_CARE_PROVIDER_SITE_OTHER): Payer: Medicare Other | Admitting: Internal Medicine

## 2020-05-01 ENCOUNTER — Other Ambulatory Visit: Payer: Self-pay

## 2020-05-01 DIAGNOSIS — R21 Rash and other nonspecific skin eruption: Secondary | ICD-10-CM

## 2020-05-01 MED ORDER — PREDNISONE 20 MG PO TABS: 40.0000 mg | ORAL_TABLET | Freq: Every day | ORAL | 0 refills | Status: AC

## 2020-05-01 MED ORDER — TRIAMCINOLONE ACETONIDE 0.1 % EX CREA: 1.0000 "application " | TOPICAL_CREAM | Freq: Two times a day (BID) | CUTANEOUS | 0 refills | Status: AC

## 2020-05-01 NOTE — Patient Instructions (Signed)
We have sent in the prednisone to start taking today, 2 pills a day for 5 days.   We have also sent in triamcinolone ointment to use 2-3 times a day until the rash is gone.

## 2020-05-02 ENCOUNTER — Encounter: Payer: Self-pay | Admitting: Internal Medicine

## 2020-05-02 DIAGNOSIS — R21 Rash and other nonspecific skin eruption: Secondary | ICD-10-CM | POA: Insufficient documentation

## 2020-05-02 NOTE — Progress Notes (Signed)
   Subjective:   Patient ID: Gordon Baker, male    DOB: 1946/08/27, 74 y.o.   MRN: [id number]  HPI The patient is a 74 YO man coming in for rash. Exposed to possibly poison oak on Friday and the rash and itching came later that day and worsened over the weekend. He is using cortisone cream otc for it which is not helping much. Mostly on right arm and left hand. Not on legs or stomach. No new medications or foods recently. Denies SOB or facial swelling. Overall stable in the last day but not improving and he is supposed to leave town tomorrow and would like this to improve.   Review of Systems  Constitutional: Negative.   HENT: Negative.   Eyes: Negative.   Respiratory: Negative for cough, chest tightness and shortness of breath.   Cardiovascular: Negative for chest pain, palpitations and leg swelling.  Gastrointestinal: Negative for abdominal distention, abdominal pain, constipation, diarrhea, nausea and vomiting.  Musculoskeletal: Negative.   Skin: Positive for rash.  Neurological: Negative.   Psychiatric/Behavioral: Negative.     Objective:  Physical Exam Constitutional:      Appearance: He is well-developed.  HENT:     Head: Normocephalic and atraumatic.  Cardiovascular:     Rate and Rhythm: Normal rate and regular rhythm.  Pulmonary:     Effort: Pulmonary effort is normal. No respiratory distress.     Breath sounds: Normal breath sounds. No wheezing or rales.  Abdominal:     General: Bowel sounds are normal. There is no distension.     Palpations: Abdomen is soft.     Tenderness: There is no abdominal tenderness. There is no rebound.  Musculoskeletal:     Cervical back: Normal range of motion.  Skin:    General: Skin is warm and dry.     Comments: Rash consistent with poison oak/ivy on the right forearm and 1 spot on the left hand  Neurological:     Mental Status: He is alert and oriented to person, place, and time.     Coordination: Coordination normal.      Vitals:   05/01/20 1541  BP: 116/74  Pulse: 65  Temp: 98.7 F (37.1 C)  TempSrc: Oral  SpO2: 98%  Weight: 176 lb (79.8 kg)  Height: 5\' 11"  (1.803 m)    This visit occurred during the SARS-CoV-2 public health emergency.  Safety protocols were in place, including screening questions prior to the visit, additional usage of staff PPE, and extensive cleaning of exam room while observing appropriate contact time as indicated for disinfecting solutions.   Assessment & Plan:

## 2020-05-02 NOTE — Assessment & Plan Note (Signed)
Rx prednisone burst and triamcinolone ointment and call back if not improving.

## 2020-06-12 ENCOUNTER — Encounter: Payer: Self-pay | Admitting: Family

## 2020-06-15 ENCOUNTER — Other Ambulatory Visit: Payer: Self-pay | Admitting: Sleep Medicine

## 2020-06-15 ENCOUNTER — Other Ambulatory Visit: Payer: Medicare Other

## 2020-06-15 DIAGNOSIS — I471 Supraventricular tachycardia: Secondary | ICD-10-CM | POA: Diagnosis not present

## 2020-06-18 LAB — NOVEL CORONAVIRUS, NAA: SARS-CoV-2, NAA: NOT DETECTED

## 2020-08-16 DIAGNOSIS — H52203 Unspecified astigmatism, bilateral: Secondary | ICD-10-CM | POA: Diagnosis not present

## 2020-08-16 DIAGNOSIS — H524 Presbyopia: Secondary | ICD-10-CM | POA: Diagnosis not present

## 2020-08-16 DIAGNOSIS — H2513 Age-related nuclear cataract, bilateral: Secondary | ICD-10-CM | POA: Diagnosis not present

## 2020-08-16 DIAGNOSIS — H5203 Hypermetropia, bilateral: Secondary | ICD-10-CM | POA: Diagnosis not present

## 2020-10-08 ENCOUNTER — Other Ambulatory Visit: Payer: Self-pay | Admitting: Family

## 2020-10-08 MED ORDER — ATORVASTATIN CALCIUM 10 MG PO TABS: 10.0000 mg | ORAL_TABLET | ORAL | 0 refills | Status: AC

## 2020-11-25 ENCOUNTER — Encounter: Payer: Self-pay | Admitting: Family

## 2021-01-04 ENCOUNTER — Encounter: Payer: Self-pay | Admitting: Family
